# Patient Record
Sex: Female | Born: 1970 | Race: Black or African American | Hispanic: No | Marital: Married | Smoking: Never smoker
Health system: Southern US, Community
[De-identification: ages and names within clinical notes are randomized; demographics above are authoritative.]

## PROBLEM LIST (undated history)

## (undated) DIAGNOSIS — I82409 Acute embolism and thrombosis of unspecified deep veins of unspecified lower extremity: Secondary | ICD-10-CM

## (undated) DIAGNOSIS — E041 Nontoxic single thyroid nodule: Secondary | ICD-10-CM

## (undated) DIAGNOSIS — L509 Urticaria, unspecified: Secondary | ICD-10-CM

## (undated) HISTORY — DX: Nontoxic single thyroid nodule: E04.1

## (undated) HISTORY — PX: BUNIONECTOMY: SHX129

## (undated) HISTORY — PX: TONSILLECTOMY: SUR1361

## (undated) HISTORY — PX: CRYOABLATION: SHX1415

## (undated) HISTORY — DX: Acute embolism and thrombosis of unspecified deep veins of unspecified lower extremity: I82.409

---

## 2005-09-08 ENCOUNTER — Ambulatory Visit: Payer: Self-pay

## 2010-08-24 ENCOUNTER — Ambulatory Visit: Payer: Self-pay

## 2010-08-26 ENCOUNTER — Ambulatory Visit: Payer: Self-pay

## 2011-09-07 ENCOUNTER — Ambulatory Visit: Payer: Self-pay

## 2011-09-09 ENCOUNTER — Ambulatory Visit: Payer: Self-pay

## 2012-04-29 ENCOUNTER — Emergency Department: Payer: Self-pay | Admitting: Emergency Medicine

## 2012-07-31 ENCOUNTER — Ambulatory Visit: Payer: Self-pay | Admitting: Internal Medicine

## 2012-08-06 ENCOUNTER — Ambulatory Visit: Payer: Self-pay | Admitting: Emergency Medicine

## 2012-09-14 ENCOUNTER — Ambulatory Visit: Payer: Self-pay

## 2013-03-04 ENCOUNTER — Ambulatory Visit: Payer: Self-pay | Admitting: Physician Assistant

## 2013-04-17 ENCOUNTER — Ambulatory Visit: Payer: Self-pay | Admitting: Internal Medicine

## 2013-06-01 ENCOUNTER — Inpatient Hospital Stay: Payer: Self-pay | Admitting: Internal Medicine

## 2013-06-01 LAB — CBC WITH DIFFERENTIAL/PLATELET
Basophil #: 0.1 10*3/uL (ref 0.0–0.1)
Basophil %: 0.8 %
Eosinophil #: 0.1 10*3/uL (ref 0.0–0.7)
Eosinophil %: 1 %
HCT: 39.9 % (ref 35.0–47.0)
Lymphocyte %: 14.9 %
MCHC: 33.7 g/dL (ref 32.0–36.0)
Neutrophil #: 7.2 10*3/uL — ABNORMAL HIGH (ref 1.4–6.5)
Neutrophil %: 78.3 %
Platelet: 223 10*3/uL (ref 150–440)

## 2013-06-01 LAB — COMPREHENSIVE METABOLIC PANEL
Albumin: 3.1 g/dL — ABNORMAL LOW (ref 3.4–5.0)
Alkaline Phosphatase: 70 U/L (ref 50–136)
Anion Gap: 6 — ABNORMAL LOW (ref 7–16)
Bilirubin,Total: 0.3 mg/dL (ref 0.2–1.0)
Calcium, Total: 8.9 mg/dL (ref 8.5–10.1)
EGFR (African American): >60
EGFR (Non-African Amer.): 55 — ABNORMAL LOW
Glucose: 98 mg/dL (ref 65–99)
Osmolality: 281 (ref 275–301)
Sodium: 140 mmol/L (ref 136–145)
Total Protein: 7.4 g/dL (ref 6.4–8.2)

## 2013-06-01 LAB — PROTIME-INR: Prothrombin Time: 13.5 secs (ref 11.5–14.7)

## 2013-06-02 LAB — CBC WITH DIFFERENTIAL/PLATELET
Basophil #: 0.1 10*3/uL (ref 0.0–0.1)
Eosinophil #: 0.2 10*3/uL (ref 0.0–0.7)
HCT: 37.3 % (ref 35.0–47.0)
HGB: 12.5 g/dL (ref 12.0–16.0)
Lymphocyte %: 32.6 %
MCH: 29.2 pg (ref 26.0–34.0)
MCV: 87 fL (ref 80–100)
Monocyte #: 0.5 x10 3/mm (ref 0.2–0.9)
Platelet: 210 10*3/uL (ref 150–440)
RDW: 13.1 % (ref 11.5–14.5)
WBC: 7.3 10*3/uL (ref 3.6–11.0)

## 2013-06-02 LAB — TSH: Thyroid Stimulating Horm: 5.39 u[IU]/mL — ABNORMAL HIGH

## 2013-06-02 LAB — LIPID PANEL: HDL Cholesterol: 59 mg/dL (ref 40–60)

## 2013-06-18 ENCOUNTER — Ambulatory Visit: Payer: Self-pay | Admitting: Oncology

## 2013-06-24 ENCOUNTER — Ambulatory Visit: Payer: Self-pay | Admitting: Oncology

## 2013-09-12 ENCOUNTER — Ambulatory Visit: Payer: Self-pay | Admitting: Internal Medicine

## 2013-09-16 ENCOUNTER — Ambulatory Visit: Payer: Self-pay

## 2013-11-19 ENCOUNTER — Ambulatory Visit: Payer: Self-pay | Admitting: Internal Medicine

## 2013-12-10 ENCOUNTER — Ambulatory Visit: Payer: Self-pay | Admitting: Family Medicine

## 2013-12-19 ENCOUNTER — Ambulatory Visit: Payer: Self-pay

## 2013-12-30 DIAGNOSIS — J45909 Unspecified asthma, uncomplicated: Secondary | ICD-10-CM | POA: Insufficient documentation

## 2013-12-30 DIAGNOSIS — Z86718 Personal history of other venous thrombosis and embolism: Secondary | ICD-10-CM | POA: Insufficient documentation

## 2013-12-30 DIAGNOSIS — K589 Irritable bowel syndrome without diarrhea: Secondary | ICD-10-CM | POA: Insufficient documentation

## 2014-01-13 ENCOUNTER — Ambulatory Visit: Payer: Self-pay

## 2014-01-22 ENCOUNTER — Ambulatory Visit: Payer: Self-pay | Admitting: Obstetrics and Gynecology

## 2014-03-24 DIAGNOSIS — N920 Excessive and frequent menstruation with regular cycle: Secondary | ICD-10-CM | POA: Insufficient documentation

## 2014-10-01 ENCOUNTER — Ambulatory Visit: Payer: Self-pay | Admitting: Physician Assistant

## 2014-11-14 NOTE — Consult Note (Signed)
Physican discharged prior to evaluation.  Will f/u in in the cancer center in ~3 weeks to discuss results of hypercoag workup. consult.  Electronic Signatures: Gerarda FractionFinnegan, Arleigh Dicola (MD)  (Signed on 10-Nov-14 15:53)  Authored  Last Updated: 10-Nov-14 15:53 by Gerarda FractionFinnegan, Jaycey Gens (MD)

## 2014-11-14 NOTE — H&P (Signed)
PATIENT NAME:  Kelly Schmidt, YAZZIE MR#:  [id number] DATE OF BIRTH:  [birth year]  DATE OF ADMISSION:  06/01/2013  PRIMARY CARE PHYSICIAN:  Dr. Judithann Graves.  CHIEF COMPLAINT:   Left leg tenderness and swollen for 2 days.   HISTORY OF PRESENT ILLNESS:  The patient is 44 year old African American female with no past medical history, presented in the ED with left lower extremity tenderness and swelling since yesterday. The patient denies any fever or chills. No cough, sputum, or shortness of breath. No chest pain, palpitation, orthopnea or nocturnal dyspnea. 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 The patient also has been on NuvaRing for 13 years for contraception. The patient denies any family history of blood clot. The patient was noted to have a DVT.  PAST MEDICAL HISTORY:  Spine DJD.   PAST SURGICAL HISTORY:  Tonsillectomy, bunionectomy.  SOCIAL HISTORY:  No smoking or drinking or illicit drugs.   FAMILY HISTORY:  Hypertension in her father.   HOME MEDICATIONS: 1.  NuvaRing 0.12 mg/0.015 mg vaginal ring. 2.  Ibuprofen 800 mg p.o. t.i.d. p.r.n.   ALLERGIES:  None.   REVIEW OF SYSTEMS:  CONSTITUTIONAL:  Denies any fever or chills. No headache or dizziness. No weakness.  EYES:  No double vision or blurry vision,  EAR, NOSE, THROAT:  No postnasal drip, slurred speech or dysphagia.  CARDIOVASCULAR:  No chest pain, palpitation, orthopnea, or nocturnal dyspnea, but has left lower extremity swelling.  PULMONARY:  No cough, sputum, shortness of breath or hemoptysis.  GASTROINTESTINAL:  No abdominal pain, nausea, vomiting or diarrhea. No melena or bloody stool.  GENITOURINARY:  No dysuria, hematuria, or incontinence.  SKIN:  No rash or jaundice.  NEUROLOGY:  No syncope, loss of consciousness, or seizure.  HEMATOLOGIC:  No easy bruising or bleeding.  ENDOCRINE:  No polyuria or polydipsia, heat or cold intolerance.   SKIN:  No rash or jaundice.   PHYSICAL EXAMINATION:  VITAL SIGNS:  Temperature 98.2, blood pressure 94/49, pulse 86, respirations 18, O2 saturation 98% in room air.  GENERAL:  Alert, awake, oriented, in no acute distress.  HEENT:  Pupils round, equal and reactive to light and accommodation.  NECK:  Supple. No JVD or carotid bruit. No lymphadenopathy. No thyromegaly. Moist oral mucosa. Clear pharynx.  CARDIOVASCULAR:  S1, S2 regular rate and rhythm. No murmurs, gallops.  PULMONARY:  Bilateral air entry. No wheezing or rales. No use of accessory muscle to breathe.  ABDOMEN:  Soft. No distention or tenderness. No organomegaly. Bowel sounds present.  EXTREMITIES:  No edema, clubbing or cyanosis. No calf tenderness. Strong bilateral pedal pulses. Left lower extremity swelling and weakness, has calf tenderness and swelling. No erythema. NEUROLOGIC:  A and O x 3. No focal deficit. Power 5/5. Sensation intact.  SKIN:  No rash or jaundice.   LABORATORY DATA:  INR 1.0. CBC are in normal range. Glucose 98, BUN 17, creatinine 1.21. Electrolytes normal. Venous duplex show acute thrombosis within superficial femoral vein distal to include popliteal vein and a small vein below the knee.   IMPRESSION:  Left lower extremity deep vein thrombosis.   PLAN OF TREATMENT:  1.  The patient will be placed for observation. Stop nuvaring,  and start Lovenox, 1 mg/kg subcutaneous q.12 hours. Also discussed with the patient about anticoagulation treatment with either warfarin or Xarelto, Pradaxa or Eliquis. Discussed the benefits and side effect of each medication with the  patient. The patient cannot decide now. She will discuss with the family member and decide tomorrow. For now, we will start Lovenox. 2.  GI prophylaxis.  I discussed the patient's condition and plan of treatment with the patient.   TIME SPENT:  About 45 minutes.   ____________________________ Shaune PollackQing Mieczyslaw Stamas, MD qc:jm D: 06/01/2013 10:00:29  ET T: 06/01/2013 10:34:28 ET JOB#: [id number]  cc: Shaune PollackQing Elston Aldape, MD, <Dictator> Shaune PollackQING Jeanpaul Biehl MD ELECTRONICALLY SIGNED 06/01/2013 13:56

## 2014-11-14 NOTE — Consult Note (Signed)
Although patient's DVT is likely related to her Nova-ring and immobility from riding in her car for a reported ~200 miles per day, have initiated a full hypercoagulable workup for completeness.  Agree with Lovenox and Xarelto.  Full consult to follow.  Electronic Signatures: Gerarda FractionFinnegan, Timothy (MD)  (Signed on 09-Nov-14 10:21)  Authored  Last Updated: 09-Nov-14 10:21 by Gerarda FractionFinnegan, Timothy (MD)

## 2014-11-14 NOTE — Discharge Summary (Signed)
PATIENT NAME:  Kelly Schmidt, Kelly Schmidt MR#:  [id number] DATE OF BIRTH:  14-Aug-1970  DATE OF ADMISSION:  06/01/2013 DATE OF DISCHARGE:  06/03/2013  PRIMARY CARE PHYSICIAN:  Dr. [name].    DISCHARGE DIAGNOSES: PE and left leg deep vein thrombosis.   CONDITION: Stable.   CODE STATUS: FULL CODE.   HOME MEDICATIONS:  1.  Percocet 325 mg/5 mg p.o. q.6 hours p.r.n.  2.  Xarelto 15 mg p.o. b.i.d. for 20 days, and then change to 20 mg p.o. daily.   DIET: Regular diet.   ACTIVITY: As tolerated.   FOLLOWUP CARE: Follow up with PCP within 1 to 2 weeks. Follow up with Dr. Welton FlakesKhan, pulmonary specialist, within 1 to 2 weeks. Follow up with Dr. Orlie DakinFinnegan, oncologist, within 1 to 2 weeks. The patient also needs to watch for bleeding.   REASON FOR ADMISSION: Left leg tenderness and swollen for 2 days.   HOSPITAL COURSE: The patient is a 44 year old African American female with no past history, who presented to the ED with some left lower extremity tenderness and swelling for 2 days.  The patient had no fever or chills, chest pain. No palpitations. No edema. No orthopnea or nocturnal dyspnea.   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 The patient is using NuvaRing for 13 years for contraception. The patient'Schmidt venous duplex  showed left lower extremity DVT. For detailed history and physical examination, please refer to the admission note dictated by me. On admission date, the patient'Schmidt laboratory  data was in normal range.   1.  Deep vein thrombosis. After admission, the patient had been treated with Lovenox 1 mg subQ q.12 hours. The patient underwent a CT angio to rule out PE, which showed pulmonary embolism. I discussed the patient'Schmidt condition and treatment plan with Dr. Welton FlakesKhan, pulmonary specialist,  and the plan of treatment also discussed with the patient about anticoagulation treatment and the benefits and side effects of Coumadin, Xarelto, Pradaxa and Eliquis. The patient preferred Xarelto. I  also discussed it with Dr. Welton FlakesKhan, pulmonary specialist. He suggested to start Xarelto and follow up with him as an outpatient. Dr.  Orlie DakinFinnegan suggested coagulation workup. The patient has no complaints after admission. Her vital signs are stable. She only has left calf tenderness. She is clinically stable and will be discharged to home today. I discussed the patient'Schmidt discharge plan with the patient, the patient'Schmidt husband, nurse, case manager and Dr. Welton FlakesKhan.   TIME SPENT: About 35 minutes.   ____________________________ Shaune PollackQing Fidelis Loth, MD qc:dmm D: 06/03/2013 15:48:00 ET T: 06/03/2013 20:02:42 ET JOB#: [id number]  cc: Shaune PollackQing Jasmane Brockway, MD, <Dictator> Shaune PollackQING Justyce Yeater MD ELECTRONICALLY SIGNED 06/04/2013 13:35

## 2015-03-20 ENCOUNTER — Encounter: Payer: Self-pay | Admitting: Family Medicine

## 2015-03-20 ENCOUNTER — Ambulatory Visit (INDEPENDENT_AMBULATORY_CARE_PROVIDER_SITE_OTHER): Payer: BC Managed Care – PPO | Admitting: Family Medicine

## 2015-03-20 VITALS — BP 122/62 | HR 80 | Ht 65.0 in | Wt 179.0 lb

## 2015-03-20 DIAGNOSIS — M519 Unspecified thoracic, thoracolumbar and lumbosacral intervertebral disc disorder: Secondary | ICD-10-CM | POA: Diagnosis not present

## 2015-03-20 MED ORDER — PREDNISONE 10 MG PO TABS: 10.0000 mg | ORAL_TABLET | Freq: Every day | ORAL | Status: DC

## 2015-03-20 MED ORDER — CYCLOBENZAPRINE HCL 10 MG PO TABS: 10.0000 mg | ORAL_TABLET | Freq: Three times a day (TID) | ORAL | Status: DC | PRN

## 2015-03-20 NOTE — Progress Notes (Signed)
Name: Kelly Schmidt   MRN: [id number]    DOB: March 31, 1971   Date:03/20/2015       Progress Note  Subjective  Chief Complaint  Chief Complaint  Patient presents with  . Follow-up    last Wed- back started having spasms- took valium at night for sleep    Back Pain This is a new (previous back discomfort) problem. The current episode started in the past 7 days. The problem occurs 2 to 4 times per day. The problem has been gradually improving since onset. The pain is present in the lumbar spine (thoracolumbar). The quality of the pain is described as aching. The pain does not radiate. The pain is moderate. The pain is worse during the night. The symptoms are aggravated by lying down and sitting (sit up in am). Stiffness is present in the morning. Pertinent negatives include no abdominal pain, bladder incontinence, bowel incontinence, chest pain, dysuria, fever, headaches, leg pain, numbness, paresis, paresthesias, perianal numbness, tingling, weakness or weight loss. She has tried analgesics for the symptoms. The treatment provided mild relief.    No problem-specific assessment & plan notes found for this encounter.   History reviewed. No pertinent past medical history.  Past Surgical History  Procedure Laterality Date  . Tonsillectomy    . Bunionectomy    . Cryoablation      heavy periods    History reviewed. No pertinent family history.  Social History   Social History  . Marital Status: Married    Spouse Name: N/A  . Number of Children: N/A  . Years of Education: N/A   Occupational History  . Not on file.   Social History Main Topics  . Smoking status: Never Smoker   . Smokeless tobacco: Not on file  . Alcohol Use: 0.0 oz/week    0 Standard drinks or equivalent per week  . Drug Use: No  . Sexual Activity: Yes   Other Topics Concern  . Not on file   Social History Narrative  . No narrative on file    Allergies  Allergen Reactions  . Aspirin Hives  .  Lidocaine      Review of Systems  Constitutional: Negative for fever, chills, weight loss and malaise/fatigue.  HENT: Negative for ear discharge, ear pain and sore throat.   Eyes: Negative for blurred vision.  Respiratory: Negative for cough, sputum production, shortness of breath and wheezing.   Cardiovascular: Negative for chest pain, palpitations and leg swelling.  Gastrointestinal: Negative for heartburn, nausea, abdominal pain, diarrhea, constipation, blood in stool, melena and bowel incontinence.  Genitourinary: Negative for bladder incontinence, dysuria, urgency, frequency and hematuria.  Musculoskeletal: Positive for back pain. Negative for myalgias, joint pain and neck pain.  Skin: Negative for rash.  Neurological: Negative for dizziness, tingling, sensory change, focal weakness, weakness, numbness, headaches and paresthesias.  Endo/Heme/Allergies: Negative for environmental allergies and polydipsia. Does not bruise/bleed easily.  Psychiatric/Behavioral: Negative for depression and suicidal ideas. The patient is not nervous/anxious and does not have insomnia.      Objective  Filed Vitals:   03/20/15 1425  BP: 122/62  Pulse: 80  Height: 5\' 5"  (1.651 m)  Weight: 179 lb (81.194 kg)    Physical Exam  Constitutional: She is well-developed, well-nourished, and in no distress. No distress.  HENT:  Head: Normocephalic and atraumatic.  Right Ear: External ear normal.  Left Ear: External ear normal.  Nose: Nose normal.  Mouth/Throat: Oropharynx is clear and moist.  Eyes: Conjunctivae and EOM are  normal. Pupils are equal, round, and reactive to light. Right eye exhibits no discharge. Left eye exhibits no discharge.  Neck: Normal range of motion. Neck supple. No JVD present. No thyromegaly present.  Cardiovascular: Normal rate, regular rhythm, normal heart sounds and intact distal pulses.  Exam reveals no gallop and no friction rub.   No murmur heard. Pulmonary/Chest: Effort  normal and breath sounds normal.  Abdominal: Soft. Bowel sounds are normal. She exhibits no mass. There is no tenderness. There is no guarding.  Musculoskeletal: Normal range of motion. She exhibits no edema.       Lumbar back: She exhibits spasm.  Lymphadenopathy:    She has no cervical adenopathy.  Neurological: She is alert. She has normal reflexes.  Skin: Skin is warm and dry. She is not diaphoretic.  Psychiatric: Mood and affect normal.      Assessment & Plan  Problem List Items Addressed This Visit      Musculoskeletal and Integument   Lumbar disc disease - Primary   Relevant Medications   cyclobenzaprine (FLEXERIL) 10 MG tablet   predniSONE (DELTASONE) 10 MG tablet        Dr. Hayden [facility]  03/20/2015

## 2015-05-13 ENCOUNTER — Encounter: Payer: Self-pay | Admitting: Internal Medicine

## 2015-05-20 ENCOUNTER — Encounter: Payer: Self-pay | Admitting: Internal Medicine

## 2015-05-20 ENCOUNTER — Ambulatory Visit (INDEPENDENT_AMBULATORY_CARE_PROVIDER_SITE_OTHER): Payer: BC Managed Care – PPO | Admitting: Internal Medicine

## 2015-05-20 VITALS — BP 120/70 | HR 68 | Ht 65.0 in | Wt 181.0 lb

## 2015-05-20 DIAGNOSIS — B9689 Other specified bacterial agents as the cause of diseases classified elsewhere: Secondary | ICD-10-CM

## 2015-05-20 DIAGNOSIS — N760 Acute vaginitis: Secondary | ICD-10-CM

## 2015-05-20 DIAGNOSIS — A499 Bacterial infection, unspecified: Secondary | ICD-10-CM

## 2015-05-20 LAB — POCT WET PREP WITH KOH
Clue Cells Wet Prep HPF POC: 5
KOH PREP POC: NEGATIVE
RBC Wet Prep HPF POC: 2
TRICHOMONAS UA: NEGATIVE
WBC WET PREP PER HPF POC: 10
YEAST WET PREP PER HPF POC: 0

## 2015-05-20 MED ORDER — METRONIDAZOLE 500 MG PO TABS: 500.0000 mg | ORAL_TABLET | Freq: Two times a day (BID) | ORAL | Status: DC

## 2015-05-20 NOTE — Progress Notes (Signed)
Date:  05/20/2015   Name:  Kelly Schmidt   DOB:  07/21/1971   MRN:  [id number]   Chief Complaint: Vaginitis Vaginal Discharge The patient's primary symptoms include a genital odor and vaginal discharge (scant clear mucus). The patient's pertinent negatives include no genital itching, genital lesions, genital rash or pelvic pain. The current episode started in the past 7 days. The problem has been unchanged. Pertinent negatives include no back pain, chills, fever, rash or urgency.   She denies any new exposures. No new personal care products. Symptoms seemed to worsen after her last menses. She is very bothered by the odor and her husband has noticed it as well. She has not tried any over-the-counter products other than a single [employer] feminine wash.   Review of Systems  Constitutional: Negative for fever and chills.  Respiratory: Negative for shortness of breath.   Cardiovascular: Negative for chest pain.  Genitourinary: Positive for vaginal discharge (scant clear mucus). Negative for urgency, vaginal bleeding and pelvic pain.  Musculoskeletal: Negative for back pain.  Skin: Negative for rash.    Patient Active Problem List   Diagnosis Date Noted  . Lumbar disc disease 03/20/2015  . Excess, menstruation 03/24/2014  . 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 H/O deep venous thrombosis 12/30/2013  . 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 Authorizing Provider  cyclobenzaprine (FLEXERIL) 10 MG tablet Take 1 tablet (10 mg total) by mouth 3 (three) times daily as needed for muscle spasms. Patient not taking: Reported on 05/20/2015 03/20/15   Duanne Limerickeanna C Jones, MD    Allergies  Allergen Reactions  . Aspirin Hives  . Lidocaine     Past Surgical History  Procedure Laterality Date  . Tonsillectomy    . Bunionectomy    . Cryoablation      heavy periods    Social History  Substance Use Topics  . Smoking  status: Never Smoker   . Smokeless tobacco: None  . Alcohol Use: 0.0 oz/week    0 Standard drinks or equivalent per week     Medication list has been reviewed and updated.   Physical Exam  Constitutional: She is oriented to person, place, and time. She appears well-developed. No distress.  HENT:  Head: Normocephalic and atraumatic.  Eyes: Conjunctivae are normal. Right eye exhibits no discharge. Left eye exhibits no discharge. No scleral icterus.  Cardiovascular: Normal rate and regular rhythm.   Pulmonary/Chest: Effort normal and breath sounds normal. No respiratory distress.  Abdominal: Soft. There is no tenderness.  Genitourinary: There is no rash or tenderness on the right labia. There is no rash or tenderness on the left labia. No erythema or bleeding in the vagina. Vaginal discharge (scant) found.  Musculoskeletal: Normal range of motion.  Neurological: She is alert and oriented to person, place, and time.  Skin: Skin is warm and dry. No rash noted.  Psychiatric: She has a normal mood and affect. Her behavior is normal. Thought content normal.  Nursing note and vitals reviewed.   BP 120/70 mmHg  Pulse 68  Ht 5\' 5"  (1.651 m)  Wt 181 lb (82.101 kg)  BMI 30.12 kg/m2  Assessment and Plan: 1. Bacterial vaginitis Wet prep shows clue cells and bacteria but no yeast or Trichomonas - metroNIDAZOLE (FLAGYL) 500 MG tablet; Take 1 tablet (500 mg total) by mouth 2 (two) times daily.  Dispense: 14 tablet; Refill: 0 - POCT Wet Prep with KOH  Bari Edward, MD [facility]  05/20/2015

## 2015-10-23 ENCOUNTER — Encounter: Payer: Self-pay | Admitting: Internal Medicine

## 2015-10-23 ENCOUNTER — Ambulatory Visit (INDEPENDENT_AMBULATORY_CARE_PROVIDER_SITE_OTHER): Payer: BC Managed Care – PPO | Admitting: Internal Medicine

## 2015-10-23 VITALS — BP 98/62 | HR 72 | Temp 98.2°F | Ht 65.0 in | Wt 179.0 lb

## 2015-10-23 DIAGNOSIS — Z20828 Contact with and (suspected) exposure to other viral communicable diseases: Secondary | ICD-10-CM | POA: Diagnosis not present

## 2015-10-23 MED ORDER — OSELTAMIVIR PHOSPHATE 75 MG PO CAPS: 75.0000 mg | ORAL_CAPSULE | Freq: Two times a day (BID) | ORAL | Status: DC

## 2015-10-23 NOTE — Patient Instructions (Signed)

## 2015-10-23 NOTE — Progress Notes (Signed)
    Date:  10/23/2015   Name:  Kelly Schmidt   DOB:  [birth year]   MRN:  [id number]   Chief Complaint: Fever Fever  This is a new problem. Associated symptoms include a sore throat. Pertinent negatives include no chest pain, coughing, ear pain, headaches or wheezing.  She had a low grade temperature last evening 99.0 and sore throat.  No headache or cough.  Mild myalgias but may be from exercise.  This AM she took tylenol for temp 99.1.  Her step son was diagnosed with influenza and PNA last weekend.  She did not get an influenza vaccine.    Review of Systems  Constitutional: Positive for fever. Negative for chills and diaphoresis.  HENT: Positive for sore throat. Negative for ear pain, sinus pressure and trouble swallowing.   Respiratory: Negative for cough, shortness of breath and wheezing.   Cardiovascular: Negative for chest pain.  Musculoskeletal: Positive for myalgias and arthralgias.  Neurological: Negative for weakness and headaches.    Patient Active Problem List   Diagnosis Date Noted  . Lumbar disc disease 03/20/2015  . Excess, menstruation 03/24/2014  . 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 H/O deep venous thrombosis 12/30/2013  . 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 Aspirin Hives  . Lidocaine     Past Surgical History  Procedure Laterality Date  . Tonsillectomy    . Bunionectomy    . Cryoablation      heavy periods    Social History  Substance Use Topics  . Smoking status: Never Smoker   . Smokeless tobacco: None  . Alcohol Use: 0.0 oz/week    0 Standard drinks or equivalent per week     Comment: occasional     Medication list has been reviewed and updated.   Physical Exam  Constitutional: She appears well-developed and well-nourished.  HENT:  Right Ear: Tympanic membrane and ear canal normal.  Left Ear: Tympanic membrane and ear canal normal.  Nose: Right  sinus exhibits no maxillary sinus tenderness. Left sinus exhibits no maxillary sinus tenderness.  Mouth/Throat: Posterior oropharyngeal erythema present. No posterior oropharyngeal edema.  Cardiovascular: Normal rate, regular rhythm and normal heart sounds.   Pulmonary/Chest: Effort normal.  Lymphadenopathy:    She has no cervical adenopathy.  Psychiatric: She has a normal mood and affect.  Nursing note and vitals reviewed.   BP 98/62 mmHg  Pulse 72  Temp(Src) 98.2 F (36.8 C) (Oral)  Ht 5\' 5"  (1.651 m)  Wt 179 lb (81.194 kg)  BMI 29.79 kg/m2  SpO2 99%  LMP 10/05/2015  Assessment and Plan: 1. Exposure to influenza Continue tylenol and rest for possible early influenza - oseltamivir (TAMIFLU) 75 MG capsule; Take 1 capsule (75 mg total) by mouth 2 (two) times daily.  Dispense: 10 capsule; Refill: 0   Bari EdwardLaura Andera Cranmer, MD [facility]  10/23/2015

## 2015-12-11 ENCOUNTER — Ambulatory Visit
Admission: RE | Admit: 2015-12-11 | Discharge: 2015-12-11 | Disposition: A | Discharge status: Home / Self Care | Payer: BC Managed Care – PPO | Source: Ambulatory Visit | Attending: Internal Medicine | Admitting: Internal Medicine

## 2015-12-11 ENCOUNTER — Encounter: Payer: Self-pay | Admitting: Internal Medicine

## 2015-12-11 ENCOUNTER — Ambulatory Visit (INDEPENDENT_AMBULATORY_CARE_PROVIDER_SITE_OTHER): Payer: BC Managed Care – PPO | Admitting: Internal Medicine

## 2015-12-11 VITALS — BP 98/76 | HR 74 | Resp 16 | Ht 65.0 in | Wt 179.0 lb

## 2015-12-11 DIAGNOSIS — M25561 Pain in right knee: Secondary | ICD-10-CM | POA: Diagnosis not present

## 2015-12-11 DIAGNOSIS — Z77123 Contact with and (suspected) exposure to radon and other naturally occuring radiation: Secondary | ICD-10-CM | POA: Diagnosis not present

## 2015-12-11 DIAGNOSIS — IMO0002 Reserved for concepts with insufficient information to code with codable children: Secondary | ICD-10-CM

## 2015-12-11 LAB — POCT URINE PREGNANCY: PREG TEST UR: NEGATIVE

## 2015-12-11 MED ORDER — METHYLPREDNISOLONE 4 MG PO TBPK: ORAL_TABLET | ORAL | Status: DC

## 2015-12-11 NOTE — Progress Notes (Signed)
    Date:  12/11/2015   Name:  Kelly Schmidt   DOB:  [birth year]   MRN:  [id number]   Chief Complaint: Knee Pain Right knee- HX of blood clots-Has added some activity [facility] and now having swelling and pain.  Discomfort is in the right anterior knee behind the kneecap. It is slightly swollen but not red or hot. She denies any calf pain or lower leg swelling. She's been taking Tylenol and ibuprofen off and on but not consistently. She has some mild discomfort and catching at times no weakness.  Review of Systems  Respiratory: Negative for shortness of breath.   Cardiovascular: Negative for chest pain, palpitations and leg swelling.  Musculoskeletal: Positive for joint swelling and arthralgias.  Neurological: Negative for weakness and numbness.  Hematological: Negative for adenopathy.    Patient Active Problem List   Diagnosis Date Noted  . Lumbar disc disease 03/20/2015  . Excess, menstruation 03/24/2014  . 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 H/O deep venous thrombosis 12/30/2013  . 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 Authorizing Provider  ibuprofen (ADVIL,MOTRIN) 200 MG tablet Take 200 mg by mouth every 6 (six) hours as needed.   Yes Historical Provider, MD    Allergies  Allergen Reactions  . Aspirin Hives  . Lidocaine     Past Surgical History  Procedure Laterality Date  . Tonsillectomy    . Bunionectomy    . Cryoablation      heavy periods    Social History  Substance Use Topics  . Smoking status: Never Smoker   . Smokeless tobacco: None  . Alcohol Use: 0.0 oz/week    0 Standard drinks or equivalent per week     Comment: occasional     Medication list has been reviewed and updated.   Physical Exam  Cardiovascular: Normal rate, regular rhythm and normal heart sounds.   Pulmonary/Chest: Effort normal and breath sounds normal.  Musculoskeletal:   Right knee: She exhibits decreased range of motion and effusion (small). She exhibits no ecchymosis and no deformity. Tenderness found. Patellar tendon tenderness noted.  Nursing note and vitals reviewed.   BP 98/76 mmHg  Pulse 74  Resp 16  Ht 5\' 5"  (1.651 m)  Wt 179 lb (81.194 kg)  BMI 29.79 kg/m2  SpO2 98%  Assessment and Plan: 1. Right knee pain Begin Ice 20 minutes twice a day Activity limitations discussed - methylPREDNISolone (MEDROL DOSEPAK) 4 MG TBPK tablet; Take 6 pills on day 1 the 5 pills day 2 then 4 pills day 3 then 3 pills day 4 then 2 pills day 5 then one pills day 6 then stop  Dispense: 21 tablet; Refill: 0 - DG Knee Complete 4 Views Right; Future  2. Radiation exposure Test done for anticipated Xray and is negative - POCT urine pregnancy   Kelly EdwardLaura Teneisha Gignac, MD [facility]  12/11/2015

## 2016-04-19 ENCOUNTER — Ambulatory Visit
Admission: EM | Admit: 2016-04-19 | Discharge: 2016-04-19 | Disposition: A | Discharge status: Home / Self Care | Payer: BC Managed Care – PPO | Attending: Emergency Medicine | Admitting: Emergency Medicine

## 2016-04-19 DIAGNOSIS — H65192 Other acute nonsuppurative otitis media, left ear: Secondary | ICD-10-CM

## 2016-04-19 DIAGNOSIS — J0110 Acute frontal sinusitis, unspecified: Secondary | ICD-10-CM | POA: Diagnosis not present

## 2016-04-19 DIAGNOSIS — R05 Cough: Secondary | ICD-10-CM

## 2016-04-19 DIAGNOSIS — R059 Cough, unspecified: Secondary | ICD-10-CM

## 2016-04-19 MED ORDER — AMOXICILLIN-POT CLAVULANATE 875-125 MG PO TABS: 1.0000 | ORAL_TABLET | Freq: Two times a day (BID) | ORAL | 0 refills | Status: DC

## 2016-04-19 MED ORDER — BENZONATATE 100 MG PO CAPS: 100.0000 mg | ORAL_CAPSULE | Freq: Three times a day (TID) | ORAL | 0 refills | Status: DC | PRN

## 2016-04-19 NOTE — ED Provider Notes (Signed)
[facility] ____________________________________________  Time seen: Approximately 8:26 AM  I have reviewed the triage vital signs and the nursing notes.   HISTORY  Chief Complaint Cough and Otalgia   HPI Kelly Schmidt is a 45 y.o. female presents with a complaint of just over a week of runny nose, nasal congestion and left ear discomfort that has worsened over the last 2-3 days. Patient reports having a lot of congestion with some postnasal drainage. Patient reports cough is worse at night with associated postnasal drainage. Patient reports that cough is primarily a dry cough, occasional production. Patient reports recently blowing nose with thick drainage. Reports sinus feel clogged. Denies hearing changes, tinnitus, dizziness or vision changes. Reports others in household recently with similar.  Denies chest pain, chest pain with deep breath, shortness of breath, abdominal pain, dysuria, extremity pain, extremity swelling, weakness or fevers. Reports continues to eat and drink well. Reports normal activity levels.  [name], MD PCP Patient's last menstrual period was 03/25/2016. Denies chance of pregnancy.   Past Medical History:  Diagnosis Date  . 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 Reports has been clear without any complications since. Denies other risk factors other than the birth control.  Patient Active Problem List   Diagnosis Date Noted  . Lumbar disc disease 03/20/2015  . Excess, menstruation 03/24/2014  . 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 H/O deep venous thrombosis 12/30/2013  . 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  Past Surgical History:  Procedure Laterality Date  . BUNIONECTOMY    . CRYOABLATION     heavy periods  . TONSILLECTOMY      Current Outpatient Rx  . Order #: [id number] Class: Historical Med  . Order #: [id number] Class: Normal  . Order #:  [id number] Class: Normal  . Order #: [id number] Class: Normal    No current facility-administered medications for this encounter.   Current Outpatient Prescriptions:  .  ibuprofen (ADVIL,MOTRIN) 200 MG tablet, Take 200 mg by mouth every 6 (six) hours as needed., Disp: , Rfl:  .  amoxicillin-clavulanate (AUGMENTIN) 875-125 MG tablet, Take 1 tablet by mouth every 12 (twelve) hours., Disp: 20 tablet, Rfl: 0 .  benzonatate (TESSALON PERLES) 100 MG capsule, Take 1 capsule (100 mg total) by mouth 3 (three) times daily as needed for cough., Disp: 15 capsule, Rfl: 0   Allergies Aspirin and Lidocaine  Family History  Problem Relation Age of Onset  . Hypertension Mother     Social History Social History  Substance Use Topics  . Smoking status: Never Smoker  . Smokeless tobacco: Never Used  . Alcohol use 0.0 oz/week     Comment: occasional    Review of Systems Constitutional: No fever/chills Eyes: No visual changes. ENT: No sore throat. Cardiovascular: Denies chest pain. Respiratory: Denies shortness of breath. Gastrointestinal: No abdominal pain.  No nausea, no vomiting.  No diarrhea.  No constipation. Genitourinary: Negative for dysuria. Musculoskeletal: Negative for back pain. Skin: Negative for rash. Neurological: Negative for headaches, focal weakness or numbness.  10-point ROS otherwise negative.  ____________________________________________   PHYSICAL EXAM:  VITAL SIGNS: ED Triage Vitals  Enc Vitals Group     BP 04/19/16 0818 106/67     Pulse Rate 04/19/16 0818 71     Resp 04/19/16 0818 18     Temp 04/19/16 0818 98.1 F (36.7 C)     Temp Source 04/19/16 0818 Oral     SpO2 04/19/16 0818 98 %  Weight 04/19/16 0815 175 lb (79.4 kg)     Height 04/19/16 0815 5\' 5"  (1.651 m)     Head Circumference --      Peak Flow --      Pain Score 04/19/16 0818 2     Pain Loc --      Pain Edu? --      Excl. in GC? --    Constitutional: Alert and oriented. Well appearing and  in no acute distress. Eyes: Conjunctivae are normal. PERRL. EOMI. Head: Atraumatic. Mild  tenderness to palpation bilateral frontal and nontender maxillary sinuses. No swelling. No erythema.   Ears: Left: mild erythema, middle ear effusion present, otherwise normal TM. Right: no erythema, normal TM. No erythema, swelling or tenderness surrounding bilaterally.   Nose: nasal congestion with bilateral nasal turbinate erythema and edema.   Mouth/Throat: Mucous membranes are moist.  Oropharynx non-erythematous.No tonsillar swelling or exudate.  Neck: No stridor.  No cervical spine tenderness to palpation. Hematological/Lymphatic/Immunilogical: No cervical lymphadenopathy. Cardiovascular: Normal rate, regular rhythm. Grossly normal heart sounds.  Good peripheral circulation. Respiratory: Normal respiratory effort.  No retractions. Lungs CTAB. No wheezes, rales or rhonchi. Good air movement.  Gastrointestinal: Soft and nontender. No distention. Normal Bowel sounds. No CVA tenderness. Musculoskeletal: No lower or upper extremity tenderness nor edema.  Bilateral pedal pulses equal and easily palpated. No cervical, thoracic or lumbar tenderness to palpation.  Neurologic:  Normal speech and language. No gross focal neurologic deficits are appreciated. No gait instability. Skin:  Skin is warm, dry and intact. No rash noted. Psychiatric: Mood and affect are normal. Speech and behavior are normal.  ___________________________________________   LABS (all labs ordered are listed, but only abnormal results are displayed)  Labs Reviewed - No data to display ____________________________________________  RADIOLOGY  No results found. ____________________________________________   PROCEDURES Procedures    INITIAL IMPRESSION / ASSESSMENT AND PLAN / ED COURSE  Pertinent labs & imaging results that were available during my care of the patient were reviewed by me and considered in my medical decision  making (see chart for details).  Very well-appearing patient. No acute distress. Presents for the complaints of just over 1 week of nasal congestion, nasal drainage and ear discomfort with associated cough. Suspect sinusitis. Will treat patient with oral Augmentin and prn Tessalon Perles. Encouraged rest, fluids and PCP follow-up as needed.Discussed indication, risks and benefits of medications with patient.  Discussed follow up with Primary care physician this week. Discussed follow up and return parameters including no resolution or any worsening concerns. Patient verbalized understanding and agreed to plan.   ____________________________________________   FINAL CLINICAL IMPRESSION(S) / ED DIAGNOSES  Final diagnoses:  Acute frontal sinusitis, recurrence not specified  Cough  Acute effusion of left ear     Discharge Medication List as of 04/19/2016  8:35 AM    START taking these medications   Details  amoxicillin-clavulanate (AUGMENTIN) 875-125 MG tablet Take 1 tablet by mouth every 12 (twelve) hours., Starting Tue 04/19/2016, Normal    benzonatate (TESSALON PERLES) 100 MG capsule Take 1 capsule (100 mg total) by mouth 3 (three) times daily as needed for cough., Starting Tue 04/19/2016, Normal        Note: This dictation was prepared with Dragon dictation along with smaller phrase technology. Any transcriptional errors that result from this process are unintentional.    Clinical Course      Renford Dills, NP 04/19/16 6045    Renford Dills, NP 04/19/16 [phone]

## 2016-04-19 NOTE — ED Triage Notes (Signed)
Patient c/o coughing and earaches. For about 2-3 days.

## 2016-04-19 NOTE — Discharge Instructions (Signed)
Take medication as prescribed. Rest. Drink plenty of fluids.  ° °Follow up with your primary care physician this week as needed. Return to Urgent care for new or worsening concerns.  ° °

## 2016-04-23 ENCOUNTER — Telehealth: Payer: Self-pay

## 2016-04-23 NOTE — Telephone Encounter (Signed)
Courtesy call back completed today for patient's recent visit at [facility]. Patient did not answer, left message on machine to call back with any questions or concerns.   

## 2016-04-29 ENCOUNTER — Ambulatory Visit (INDEPENDENT_AMBULATORY_CARE_PROVIDER_SITE_OTHER): Payer: BC Managed Care – PPO | Admitting: Internal Medicine

## 2016-04-29 ENCOUNTER — Encounter: Payer: Self-pay | Admitting: Internal Medicine

## 2016-04-29 VITALS — BP 120/72 | HR 84 | Ht 65.0 in | Wt 176.0 lb

## 2016-04-29 DIAGNOSIS — H6992 Unspecified Eustachian tube disorder, left ear: Secondary | ICD-10-CM | POA: Diagnosis not present

## 2016-04-29 NOTE — Patient Instructions (Addendum)
Resume fexofenadine 180 mg daily and Fluticasone nasal spray  Barotitis Media Barotitis media is inflammation of your middle ear. This occurs when the auditory tube (eustachian tube) leading from the back of your nose (nasopharynx) to your eardrum is blocked. This blockage may result from a cold, environmental allergies, or an upper respiratory infection. Unresolved barotitis media may lead to damage or hearing loss (barotrauma), which may become permanent. HOME CARE INSTRUCTIONS   Use medicines as recommended by your health care provider. Over-the-counter medicines will help unblock the canal and can help during times of air travel.  Do not put anything into your ears to clean or unplug them. Eardrops will not be helpful.  Do not swim, dive, or fly until your health care provider says it is all right to do so. If these activities are necessary, chewing gum with frequent, forceful swallowing may help. It is also helpful to hold your nose and gently blow to pop your ears for equalizing pressure changes. This forces air into the eustachian tube.  Only take over-the-counter or prescription medicines for pain, discomfort, or fever as directed by your health care provider.  A decongestant may be helpful in decongesting the middle ear and make pressure equalization easier. SEEK MEDICAL CARE IF:  You experience a serious form of dizziness in which you feel as if the room is spinning and you feel nauseated (vertigo).  Your symptoms only involve one ear. SEEK IMMEDIATE MEDICAL CARE IF:   You develop a severe headache, dizziness, or severe ear pain.  You have bloody or pus-like drainage from your ears.  You develop a fever.  Your problems do not improve or become worse. MAKE SURE YOU:   Understand these instructions.  Will watch your condition.  Will get help right away if you are not doing well or get worse.   This information is not intended to replace advice given to you by your health  care provider. Make sure you discuss any questions you have with your health care provider.   Document Released: 07/08/2000 Document Revised: 05/01/2013 Document Reviewed: 02/05/2013 Elsevier Interactive Patient Education [employer]2016 Elsevier Inc.

## 2016-04-29 NOTE — Progress Notes (Signed)
    Date:  04/29/2016   Name:  Kelly Schmidt   DOB:  1970-12-24   MRN:  [id number]   Chief Complaint: Follow-up (urgent care ear pain- finished Aug, Tessalon Perles and Medrol dose pack- L) ear is worse that R)/ feel "like they are closing up")  Otalgia   There is pain in the left ear. This is a new problem. The problem occurs constantly. The problem has been unchanged. There has been no fever. Associated symptoms include headaches. Pertinent negatives include no coughing, diarrhea or sore throat. She has tried antibiotics for the symptoms. The treatment provided no relief.  Treated with Augmentin and Tessalon.  Not taking any antihistamines or nasal spray.   Review of Systems  HENT: Positive for ear pain. Negative for sore throat.   Respiratory: Negative for cough.   Gastrointestinal: Negative for diarrhea.  Neurological: Positive for headaches.    Patient Active Problem List   Diagnosis Date Noted  . Lumbar disc disease 03/20/2015  . Excess, menstruation 03/24/2014  . 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 H/O deep venous thrombosis 12/30/2013  . 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 Authorizing Provider  ibuprofen (ADVIL,MOTRIN) 200 MG tablet Take 200 mg by mouth every 6 (six) hours as needed.   Yes Historical Provider, MD    Allergies  Allergen Reactions  . Aspirin Hives  . Lidocaine     Past Surgical History:  Procedure Laterality Date  . BUNIONECTOMY    . CRYOABLATION     heavy periods  . TONSILLECTOMY      Social History  Substance Use Topics  . Smoking status: Never Smoker  . Smokeless tobacco: Never Used  . Alcohol use 0.0 oz/week     Comment: occasional     Medication list has been reviewed and updated.   Physical Exam  Constitutional: She is oriented to person, place, and time. She appears well-developed. No distress.  HENT:  Head: Normocephalic and atraumatic.  Right  Ear: Tympanic membrane and ear canal normal.  Left Ear: Tympanic membrane and ear canal normal. Tympanic membrane is not erythematous.  No middle ear effusion.  Nose: Right sinus exhibits no maxillary sinus tenderness and no frontal sinus tenderness. Left sinus exhibits no maxillary sinus tenderness and no frontal sinus tenderness.  Mouth/Throat: Oropharynx is clear and moist.  Cardiovascular: Normal rate, regular rhythm and normal heart sounds.   Pulmonary/Chest: Effort normal and breath sounds normal. No respiratory distress.  Musculoskeletal: Normal range of motion.  Neurological: She is alert and oriented to person, place, and time.  Skin: Skin is warm and dry. No rash noted.  Psychiatric: She has a normal mood and affect. Her behavior is normal. Thought content normal.  Nursing note and vitals reviewed.   BP 120/72   Pulse 84   Ht 5\' 5"  (1.651 m)   Wt 176 lb (79.8 kg)   LMP 04/29/2016 (Exact Date)   BMI 29.29 kg/m   Assessment and Plan: 1. Eustachian tube disorder, left OM resolved - finish antibiotics Begin Allegra and Flonase NS   Bari EdwardLaura Casady Voshell, MD [facility]  04/29/2016

## 2016-06-26 ENCOUNTER — Ambulatory Visit
Admission: EM | Admit: 2016-06-26 | Discharge: 2016-06-26 | Disposition: A | Discharge status: Home / Self Care | Payer: BC Managed Care – PPO | Attending: Family Medicine | Admitting: Family Medicine

## 2016-06-26 ENCOUNTER — Encounter: Payer: Self-pay | Admitting: Emergency Medicine

## 2016-06-26 DIAGNOSIS — H6992 Unspecified Eustachian tube disorder, left ear: Secondary | ICD-10-CM | POA: Diagnosis not present

## 2016-06-26 NOTE — ED Provider Notes (Signed)
[facility]    CSN: [id number] Arrival date & time: 06/26/16  [id number]     History   Chief Complaint Chief Complaint  Patient presents with  . Otalgia    HPI Kelly Medinaamela Snipes Schmidt is a 45 y.o. female.   The history is provided by the patient.  Otalgia  Location:  Left Quality:  Aching and pressure Severity:  Moderate Onset quality:  Gradual Duration:  2 weeks Timing:  Constant Progression:  Worsening Chronicity:  Recurrent Context: not direct blow, not elevation change, not foreign body in ear, not loud noise, not recent URI and not water in ear   Relieved by:  Nothing Ineffective treatments:  OTC medications Associated symptoms: no abdominal pain, no congestion, no cough, no diarrhea, no ear discharge, no fever, no headaches, no hearing loss, no neck pain, no rash, no rhinorrhea, no sore throat, no tinnitus and no vomiting   Risk factors: no recent travel, no chronic ear infection and no prior ear surgery     Past Medical History:  Diagnosis Date  . DVT (deep venous thrombosis) [facility](HCC)     Patient Active Problem List   Diagnosis Date Noted  . Lumbar disc disease 03/20/2015  . Excess, menstruation 03/24/2014  . 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 H/O deep venous thrombosis 12/30/2013  . 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 BUNIONECTOMY    . CRYOABLATION     heavy periods  . TONSILLECTOMY      OB History    No data available       Home Medications    Prior to Admission medications   Medication Sig Start Date End Date Taking? Authorizing Provider  naproxen (NAPROSYN) 500 MG tablet Take 500 mg by mouth as needed.   Yes Historical Provider, MD    Family History Family History  Problem Relation Age of Onset  . Hypertension Mother     Social History Social History  Substance Use Topics  . Smoking status: Never Smoker  . Smokeless tobacco: Never Used  . Alcohol use 0.0 oz/week   Comment: occasional     Allergies   Aspirin and Lidocaine   Review of Systems Review of Systems  Constitutional: Negative for fever.  HENT: Positive for ear pain. Negative for congestion, ear discharge, hearing loss, rhinorrhea, sore throat and tinnitus.   Respiratory: Negative for cough.   Gastrointestinal: Negative for abdominal pain, diarrhea and vomiting.  Musculoskeletal: Negative for neck pain.  Skin: Negative for rash.  Neurological: Negative for headaches.     Physical Exam Triage Vital Signs ED Triage Vitals  Enc Vitals Group     BP 06/26/16 0841 109/65     Pulse Rate 06/26/16 0841 71     Resp 06/26/16 0841 16     Temp 06/26/16 0841 98.5 F (36.9 C)     Temp Source 06/26/16 0841 Oral     SpO2 06/26/16 0841 100 %     Weight 06/26/16 0840 176 lb (79.8 kg)     Height 06/26/16 0840 5\' 5"  (1.651 m)     Head Circumference --      Peak Flow --      Pain Score 06/26/16 0843 4     Pain Loc --      Pain Edu? --      Excl. in GC? --    No data found.   Updated Vital Signs BP 109/65 (BP Location: Left Arm)   Pulse 71  Temp 98.5 F (36.9 C) (Oral)   Resp 16   Ht 5\' 5"  (1.651 m)   Wt 176 lb (79.8 kg)   LMP 06/23/2016   SpO2 100%   BMI 29.29 kg/m   Visual Acuity Right Eye Distance:   Left Eye Distance:   Bilateral Distance:    Right Eye Near:   Left Eye Near:    Bilateral Near:     Physical Exam  Constitutional: She appears well-developed and well-nourished. No distress.  HENT:  Head: Normocephalic and atraumatic.  Right Ear: Tympanic membrane, external ear and ear canal normal.  Left Ear: Tympanic membrane, external ear and ear canal normal.  Nose: No mucosal edema, rhinorrhea, nose lacerations, sinus tenderness, nasal deformity, septal deviation or nasal septal hematoma. No epistaxis.  No foreign bodies. Right sinus exhibits no maxillary sinus tenderness and no frontal sinus tenderness. Left sinus exhibits no maxillary sinus tenderness and no  frontal sinus tenderness.  Mouth/Throat: Uvula is midline, oropharynx is clear and moist and mucous membranes are normal. No oropharyngeal exudate.  Eyes: Conjunctivae and EOM are normal. Pupils are equal, round, and reactive to light. Right eye exhibits no discharge. Left eye exhibits no discharge. No scleral icterus.  Neck: Normal range of motion. Neck supple. No thyromegaly present.  Cardiovascular: Normal rate, regular rhythm and normal heart sounds.   Pulmonary/Chest: Effort normal and breath sounds normal. No respiratory distress. She has no wheezes. She has no rales.  Lymphadenopathy:    She has no cervical adenopathy.  Skin: She is not diaphoretic.  Nursing note and vitals reviewed.    UC Treatments / Results  Labs (all labs ordered are listed, but only abnormal results are displayed) Labs Reviewed - No data to display  EKG  EKG Interpretation None       Radiology No results found.  Procedures Procedures (including critical care time)  Medications Ordered in UC Medications - No data to display   Initial Impression / Assessment and Plan / UC Course  I have reviewed the triage vital signs and the nursing notes.  Pertinent labs & imaging results that were available during my care of the patient were reviewed by me and considered in my medical decision making (see chart for details).  Clinical Course       Final Clinical Impressions(s) / UC Diagnoses   Final diagnoses:  Eustachian tube disorder, left    New Prescriptions Discharge Medication List as of 06/26/2016  9:08 AM     1. diagnosis reviewed with patient 2. Recommend supportive treatment with otc antihistamine-decongestant combination, flonase, otc analgesics 4. Recommend follow up with ENT specialist for further evaluation and management 5. Follow-up prn if symptoms worsen or don't improve   Payton Mccallumrlando Renne Platts, MD 06/26/16 [phone]

## 2016-06-26 NOTE — ED Triage Notes (Signed)
Patient c/o pain in her left ear for the past month.  Patient denies fevers.

## 2016-06-27 ENCOUNTER — Other Ambulatory Visit: Payer: Self-pay | Admitting: Internal Medicine

## 2016-06-27 ENCOUNTER — Telehealth: Payer: Self-pay

## 2016-06-27 ENCOUNTER — Other Ambulatory Visit: Payer: Self-pay

## 2016-06-27 DIAGNOSIS — H6983 Other specified disorders of Eustachian tube, bilateral: Secondary | ICD-10-CM

## 2016-06-27 MED ORDER — PREDNISONE 10 MG PO TABS: ORAL_TABLET | ORAL | 0 refills | Status: DC

## 2016-06-27 NOTE — Telephone Encounter (Signed)
Patient called for ENT. Advised [facility] may be faqster and she said it is January before she can be seen. Per Dr. Judithann GravesBerglund we will begin Prednisone Taper. Send to [facility]

## 2016-06-29 ENCOUNTER — Telehealth: Payer: Self-pay

## 2016-06-29 NOTE — Telephone Encounter (Signed)
Courtesy call back completed today for patient's recent visit at [facility]. Patient did not answer, left message on machine to call back with any questions or concerns.   

## 2016-10-06 ENCOUNTER — Ambulatory Visit (INDEPENDENT_AMBULATORY_CARE_PROVIDER_SITE_OTHER): Payer: BC Managed Care – PPO | Admitting: Internal Medicine

## 2016-10-06 ENCOUNTER — Encounter: Payer: Self-pay | Admitting: Internal Medicine

## 2016-10-06 VITALS — BP 98/62 | HR 78 | Ht 65.0 in | Wt 171.0 lb

## 2016-10-06 DIAGNOSIS — M62830 Muscle spasm of back: Secondary | ICD-10-CM | POA: Diagnosis not present

## 2016-10-06 DIAGNOSIS — B351 Tinea unguium: Secondary | ICD-10-CM

## 2016-10-06 MED ORDER — CYCLOBENZAPRINE HCL 10 MG PO TABS: 10.0000 mg | ORAL_TABLET | Freq: Three times a day (TID) | ORAL | 0 refills | Status: DC | PRN

## 2016-10-06 NOTE — Progress Notes (Signed)
Date:  10/06/2016   Name:  Kelly Schmidt   DOB:  December 28, 1970   MRN:  [id number]   Chief Complaint: Back Pain (Entire lower back hurts. Went to dance class and back stiffened up, and husband cracked back and felt some relief. But still painful when you sit for long periods of time.) and Ingrown Toenail (R) great toe nail is inflammed and hurts.) Back Pain  This is a recurrent problem. The current episode started in the past 7 days. The problem occurs constantly. The problem is unchanged. The pain is present in the sacro-iliac. The quality of the pain is described as cramping and aching. The pain does not radiate. The pain is mild. The pain is worse during the night (and after sitting). The symptoms are aggravated by twisting and sitting. Pertinent negatives include no bladder incontinence, bowel incontinence, chest pain, fever, numbness, paresthesias, pelvic pain or weakness.  Toe Pain   There was no injury mechanism. The pain is present in the right toes. The pain has been improving since onset. Pertinent negatives include no numbness. The symptoms are aggravated by weight bearing. Treatments tried: elevated the corners of the nail on the right great toe. The treatment provided moderate relief.    Review of Systems  Constitutional: Negative for fever.  Respiratory: Negative for chest tightness, shortness of breath and wheezing.   Cardiovascular: Negative for chest pain, palpitations and leg swelling.  Gastrointestinal: Negative for bowel incontinence.  Genitourinary: Negative for bladder incontinence and pelvic pain.  Musculoskeletal: Positive for back pain (and stiffness).  Skin:       Nail thickening and discoloration right great and second toe nails  Neurological: Negative for weakness, numbness and paresthesias.    Patient Active Problem List   Diagnosis Date Noted  . Lumbar disc disease 03/20/2015  . Excess, menstruation 03/24/2014  . 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  H/O deep venous thrombosis 12/30/2013  . 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 Aspirin Hives  . Lidocaine     Past Surgical History:  Procedure Laterality Date  . BUNIONECTOMY    . CRYOABLATION     heavy periods  . TONSILLECTOMY      Social History  Substance Use Topics  . Smoking status: Never Smoker  . Smokeless tobacco: Never Used  . Alcohol use 0.0 oz/week     Comment: occasional     Medication list has been reviewed and updated.   Physical Exam  Constitutional: She is oriented to person, place, and time. She appears well-developed. No distress.  HENT:  Head: Normocephalic and atraumatic.  Cardiovascular: Normal rate, regular rhythm and normal heart sounds.   Pulmonary/Chest: Effort normal and breath sounds normal. No respiratory distress.  Musculoskeletal: Normal range of motion.       Lumbar back: She exhibits spasm. She exhibits no tenderness.       Back:  SLR negative bilaterally  Neurological: She is alert and oriented to person, place, and time. She has normal strength and normal reflexes. No sensory deficit.  Skin: Skin is warm and dry. No rash noted.  Thickened toe nails on right great and second toe No ingrown nail, inflammation or tenderness  Psychiatric: She has a normal mood and affect. Her behavior is normal. Thought content normal.  Nursing note and vitals reviewed.   BP 98/62 (BP Location: Right Arm, Patient Position: Sitting, Cuff Size: Normal)  Pulse 78   Ht 5\' 5"  (1.651 m)   Wt 171 lb (77.6 kg)   LMP 09/07/2016 (Exact Date)   SpO2 98%   BMI 28.46 kg/m   Assessment and Plan: 1. Muscle spasm of back Continue heat and tylenol Add Flexeril at hs  2. Fungal nail infection Reassurance - no further treatment needed Consult Podiatry if nail bed becomes inflammed   Meds ordered this encounter  Medications  . cyclobenzaprine (FLEXERIL) 10 MG tablet    Sig:  Take 1 tablet (10 mg total) by mouth 3 (three) times daily as needed for muscle spasms.    Dispense:  30 tablet    Refill:  0    [name], MD [facility]  10/06/2016

## 2016-10-14 ENCOUNTER — Other Ambulatory Visit: Payer: Self-pay | Admitting: Internal Medicine

## 2016-10-14 ENCOUNTER — Telehealth: Payer: Self-pay

## 2016-10-14 MED ORDER — PREDNISONE 10 MG PO TABS: ORAL_TABLET | ORAL | 0 refills | Status: DC

## 2016-10-14 NOTE — Telephone Encounter (Signed)
Pt called stating Hives are worse and now in face/ lip is swollen. Said prednisone helped for this before. Would like prednisone sent to pharmacy.

## 2016-10-14 NOTE — Telephone Encounter (Signed)
Done

## 2016-10-15 ENCOUNTER — Encounter: Payer: Self-pay | Admitting: Emergency Medicine

## 2016-10-15 ENCOUNTER — Emergency Department
Admission: EM | Admit: 2016-10-15 | Discharge: 2016-10-15 | Disposition: A | Discharge status: Home / Self Care | Payer: BC Managed Care – PPO | Attending: Emergency Medicine | Admitting: Emergency Medicine

## 2016-10-15 DIAGNOSIS — T7840XA Allergy, unspecified, initial encounter: Secondary | ICD-10-CM | POA: Diagnosis not present

## 2016-10-15 MED ORDER — METHYLPREDNISOLONE SODIUM SUCC 125 MG IJ SOLR
100.0000 mg | Freq: Once | INTRAMUSCULAR | Status: AC
  Administered 2016-10-15: 100 mg via INTRAMUSCULAR
  Filled 2016-10-15: qty 2

## 2016-10-15 MED ORDER — DIPHENHYDRAMINE HCL 50 MG/ML IJ SOLN
25.0000 mg | Freq: Once | INTRAMUSCULAR | Status: AC
  Administered 2016-10-15: 25 mg via INTRAMUSCULAR
  Filled 2016-10-15: qty 1

## 2016-10-15 MED ORDER — RANITIDINE HCL 150 MG PO TABS: 150.0000 mg | ORAL_TABLET | Freq: Two times a day (BID) | ORAL | 0 refills | Status: DC

## 2016-10-15 NOTE — ED Provider Notes (Signed)
[facility] Emergency Department Provider Note  ____________________________________________  Time seen: Approximately 9:28 PM  I have reviewed the triage vital signs and the nursing notes.   HISTORY  Chief Complaint Allergic Reaction    HPI Kelly Schmidt is a 46 y.o. female that presents to the emergency department with allergic reaction for 2 weeks. Rash itches and is over her arms, back, legs. Patient states that this has happened several times in the past. Patient states that usually she can control it with prednisone and Benadryl. Patient was started on prednisone yesterday from her PCP. Patient has not had Benadryl since Thursday. 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 Patient states that she switched soap to [place] sensitive skin after reaction started. Patient has not changed laundry detergent. Patient denies fever, difficulty breathing, throat swelling, chest pain, nausea, vomiting, abdominal pain.   Past Medical History:  Diagnosis Date  . DVT (deep venous thrombosis) [facility])     Patient Active Problem List   Diagnosis Date Noted  . Lumbar disc disease 03/20/2015  . Excess, menstruation 03/24/2014  . 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 H/O deep venous thrombosis 12/30/2013  . 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 BUNIONECTOMY    . CRYOABLATION     heavy periods  . TONSILLECTOMY      Prior to Admission medications   Medication Sig Start Date End Date Taking? Authorizing Provider  cyclobenzaprine (FLEXERIL) 10 MG tablet Take 1 tablet (10 mg total) by mouth 3 (three) times daily as needed for muscle spasms. 10/06/16   Reubin Milan, MD  predniSONE (DELTASONE) 10 MG tablet Take 6 on day 1, 5 on day 2, 4 on day 3, 3 on day 4, 2 on day 5 and 1 on day 1 then stop. 10/14/16   Reubin Milan, MD   ranitidine (ZANTAC) 150 MG tablet Take 1 tablet (150 mg total) by mouth 2 (two) times daily. 10/15/16 10/15/17  Enid Derry, PA-C    Allergies Aspirin and Lidocaine  Family History  Problem Relation Age of Onset  . Hypertension Mother     Social History Social History  Substance Use Topics  . Smoking status: Never Smoker  . Smokeless tobacco: Never Used  . Alcohol use 0.0 oz/week     Comment: occasional     Review of Systems  Constitutional: No fever/chills Cardiovascular: No chest pain. Respiratory: No cough. No SOB. Gastrointestinal: No abdominal pain.  No nausea, no vomiting.  Musculoskeletal: Negative for musculoskeletal pain. Skin: Negative for abrasions, lacerations, ecchymosis. Neurological: Negative for headaches, numbness or tingling   ____________________________________________   PHYSICAL EXAM:  VITAL SIGNS: ED Triage Vitals  Enc Vitals Group     BP 10/15/16 2032 132/70     Pulse Rate 10/15/16 2032 85     Resp 10/15/16 2032 18     Temp 10/15/16 2032 98.2 F (36.8 C)     Temp Source 10/15/16 2032 Oral     SpO2 10/15/16 2032 100 %     Weight 10/15/16 2031 171 lb (77.6 kg)     Height 10/15/16 2031 5\' 5"  (1.651 m)     Head Circumference --      Peak Flow --      Pain Score --      Pain Loc --      Pain Edu? --  Excl. in GC? --      Constitutional: Alert and oriented. Well appearing and in no acute distress. Eyes: Conjunctivae are normal. PERRL. EOMI. Head: Atraumatic. ENT:      Ears:      Nose: No congestion/rhinnorhea.      Mouth/Throat: Mucous membranes are moist.  Neck: No stridor.   Cardiovascular: Normal rate, regular rhythm.  Good peripheral circulation. Respiratory: Normal respiratory effort without tachypnea or retractions. Lungs CTAB. Good air entry to the bases with no decreased or absent breath sounds. Musculoskeletal: Full range of motion to all extremities. No gross deformities appreciated. Neurologic:  Normal speech and  language. No gross focal neurologic deficits are appreciated.  Skin:  Skin is warm, dry and intact. Wheals over left arm, right back, and the back of both legs.  Psychiatric: Mood and affect are normal. Speech and behavior are normal. Patient exhibits appropriate insight and judgement.   ____________________________________________   LABS (all labs ordered are listed, but only abnormal results are displayed)  Labs Reviewed - No data to display ____________________________________________  EKG   ____________________________________________  RADIOLOGY   No results found.  ____________________________________________    PROCEDURES  Procedure(s) performed:    Procedures    Medications  methylPREDNISolone sodium succinate (SOLU-MEDROL) 125 mg/2 mL injection 100 mg (100 mg Intramuscular Given 10/15/16 2154)  diphenhydrAMINE (BENADRYL) injection 25 mg (25 mg Intramuscular Given 10/15/16 2152)     ____________________________________________   INITIAL IMPRESSION / ASSESSMENT AND PLAN / ED COURSE  Pertinent labs & imaging results that were available during my care of the patient were reviewed by me and considered in my medical decision making (see chart for details).  Review of the [place] CSRS was performed in accordance of the [facility] prior to dispensing any controlled drugs.     Patient's diagnosis is consistent with allergic reaction. Vital signs and exam are reassuring. No indication of anaphylaxis. Patient received prednisone injection and Benadryl injection in ED. Patient has a driver. Patient will continue taking prednisone prescribed by PCP. Patient will also begin taking Benadryl and famotidine. Patient will be discharged home with prescriptions for famotidine. Patient is to follow up with PCP as directed. Patient is given ED precautions to return to the ED for any worsening or new symptoms.     ____________________________________________  FINAL CLINICAL  IMPRESSION(S) / ED DIAGNOSES  Final diagnoses:  Allergic reaction, initial encounter      NEW MEDICATIONS STARTED DURING THIS VISIT:  Discharge Medication List as of 10/15/2016 10:46 PM    START taking these medications   Details  ranitidine (ZANTAC) 150 MG tablet Take 1 tablet (150 mg total) by mouth 2 (two) times daily., Starting Sat 10/15/2016, Until Sun 10/15/2017, Print            This chart was dictated using voice recognition software/Dragon. Despite best efforts to proofread, errors can occur which can change the meaning. Any change was purely unintentional.    Enid DerryAshley Samariah Hokenson, PA-C 10/15/16 2329    Merrily BrittleNeil Rifenbark, MD 10/16/16 [phone]

## 2016-10-15 NOTE — ED Triage Notes (Signed)
Patient states that she has had an allergic reaction times two weeks. Patient with rash to arms and chest. Patient states that she was started on prednisone yesterday.

## 2016-10-15 NOTE — ED Notes (Signed)
Pt reports she noticed on 10/03/16 that she developed a rash on herl body with hives. Pt reports on Thursday she saw her PCP who prescribed her prednisone which she started on Friday. Pt reports not getting any better. Pt also reports occasionally feeling short winded at times. Resp even and unlabored at this time and no wheezing noted to ascultation. Pt is no longer taking benadryl.

## 2016-10-17 ENCOUNTER — Telehealth: Payer: Self-pay

## 2016-10-17 ENCOUNTER — Other Ambulatory Visit: Payer: Self-pay | Admitting: Internal Medicine

## 2016-10-17 DIAGNOSIS — T783XXD Angioneurotic edema, subsequent encounter: Secondary | ICD-10-CM

## 2016-10-17 NOTE — Telephone Encounter (Signed)
Referral placed to [facility].

## 2016-10-17 NOTE — Telephone Encounter (Signed)
Pt called stating she had to be seen in ED for hives and swelling. Was told to take prednisone and benadryl. Pt states she broke out in hives again last night and would like a referral to see a allergy specialist for this recurrent problem.

## 2016-10-17 NOTE — Telephone Encounter (Signed)
Pt informed

## 2016-12-28 ENCOUNTER — Other Ambulatory Visit: Payer: Self-pay | Admitting: Internal Medicine

## 2016-12-29 ENCOUNTER — Other Ambulatory Visit: Payer: Self-pay | Admitting: Internal Medicine

## 2016-12-29 ENCOUNTER — Encounter: Payer: Self-pay | Admitting: Internal Medicine

## 2016-12-29 ENCOUNTER — Ambulatory Visit (INDEPENDENT_AMBULATORY_CARE_PROVIDER_SITE_OTHER): Payer: BC Managed Care – PPO | Admitting: Internal Medicine

## 2016-12-29 VITALS — BP 112/64 | HR 70 | Ht 65.0 in | Wt 172.0 lb

## 2016-12-29 DIAGNOSIS — N289 Disorder of kidney and ureter, unspecified: Secondary | ICD-10-CM

## 2016-12-29 DIAGNOSIS — L509 Urticaria, unspecified: Secondary | ICD-10-CM | POA: Diagnosis not present

## 2016-12-29 DIAGNOSIS — L501 Idiopathic urticaria: Secondary | ICD-10-CM | POA: Insufficient documentation

## 2016-12-29 NOTE — Progress Notes (Signed)
Date:  12/29/2016   Name:  Kelly Schmidt   DOB:  29-Mar-1971   MRN:  [id number]   Chief Complaint: Abnormal Lab ([facility] has been [facility] past 2 lab tests. Wanted to see PCP to discuss. )  10/2016 - Cr 1.3, bun 14, GFR 53  11/2013 - Cr 1.2, bun14, GFR 59  05/2013 - Cr 0.9, bun 14, GRF > 60  Review of Systems  Constitutional: Negative for chills, fatigue and fever.  Respiratory: Negative for chest tightness and shortness of breath.   Cardiovascular: Negative for chest pain and leg swelling.  Gastrointestinal: Negative for abdominal pain.  Genitourinary: Negative for dysuria, flank pain, frequency, hematuria and urgency.  Musculoskeletal: Negative for arthralgias.  Psychiatric/Behavioral: Negative for sleep disturbance.    Patient Active Problem List   Diagnosis Date Noted  . Lumbar disc disease 03/20/2015  . Excess, menstruation 03/24/2014  . 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 H/O deep venous thrombosis 12/30/2013  . 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 Authorizing Provider  cetirizine (ZYRTEC) 10 MG tablet Take 10 mg by mouth daily.   Yes [provider]  diphenhydrAMINE (BENADRYL) 50 MG capsule Take 50 mg by mouth every 6 (six) hours as needed.   Yes [provider]  fexofenadine (ALLEGRA) 30 MG tablet Take 30 mg by mouth 2 (two) times daily.   Yes [provider]  predniSONE (DELTASONE) 10 MG tablet Take 10 mg by mouth daily with breakfast.   Yes [provider]  ranitidine (ZANTAC) 150 MG capsule Take 150 mg by mouth 2 (two) times daily.   Yes [provider]    Allergies  Allergen Reactions  . Aspirin Hives    Past Surgical History:  Procedure Laterality Date  . BUNIONECTOMY    . CRYOABLATION     heavy periods  . TONSILLECTOMY      Social History  Substance Use Topics  . Smoking status: Never  Smoker  . Smokeless tobacco: Never Used  . Alcohol use 0.0 oz/week     Comment: occasional     Medication list has been reviewed and updated.   Physical Exam  Constitutional: She is oriented to person, place, and time. She appears well-developed. No distress.  HENT:  Head: Normocephalic and atraumatic.  Cardiovascular: Normal rate, regular rhythm and normal heart sounds.   Pulmonary/Chest: Effort normal and breath sounds normal. No respiratory distress. She has no wheezes.  Abdominal: Soft. Bowel sounds are normal. There is no CVA tenderness.  Musculoskeletal: She exhibits no edema.  Neurological: She is alert and oriented to person, place, and time.  Skin: Skin is warm and dry. No rash noted.  Psychiatric: She has a normal mood and affect. Her speech is normal and behavior is normal. Thought content normal.  Nursing note and vitals reviewed.   BP 112/64   Pulse 70   Ht 5\' 5"  (1.651 m)   Wt 172 lb (78 kg)   LMP 11/18/2016 Comment: Overdue for this by 13 days.   SpO2 98%   BMI 28.62 kg/m   Assessment and Plan: 1. Renal insufficiency meds reviewed, avoid nsaids Suspect elevated CR due to increased lean muscle mass - Basic metabolic panel - Microalbumin / creatinine urine ratio  2. Urticaria Being follow by specialist   No orders of the defined types were placed in this encounter.   Bari EdwardLaura Ariq Khamis, MD [facility]  12/29/2016

## 2016-12-29 NOTE — Patient Instructions (Signed)
Avoid prolonged high doses of Advil or Aleve or similar.

## 2016-12-30 ENCOUNTER — Ambulatory Visit: Payer: BC Managed Care – PPO | Admitting: Internal Medicine

## 2016-12-30 LAB — BASIC METABOLIC PANEL
BUN/Creatinine Ratio: 14 (ref 9–23)
BUN: 16 mg/dL (ref 6–24)
CHLORIDE: 100 mmol/L (ref 96–106)
CO2: 23 mmol/L (ref 18–29)
CREATININE: 1.15 mg/dL — AB (ref 0.57–1.00)
Calcium: 9.2 mg/dL (ref 8.7–10.2)
GFR calc Af Amer: 66 mL/min/{1.73_m2} (ref >59)
GFR calc non Af Amer: 57 mL/min/{1.73_m2} — ABNORMAL LOW (ref >59)
GLUCOSE: 79 mg/dL (ref 65–99)
Potassium: 4.4 mmol/L (ref 3.5–5.2)
Sodium: 138 mmol/L (ref 134–144)

## 2017-01-02 LAB — MICROALBUMIN / CREATININE URINE RATIO
CREATININE, UR: 140.5 mg/dL
Microalb/Creat Ratio: <2.1 mg/g creat (ref 0.0–30.0)

## 2017-02-27 ENCOUNTER — Telehealth: Payer: Self-pay

## 2017-02-27 ENCOUNTER — Other Ambulatory Visit: Payer: Self-pay | Admitting: Internal Medicine

## 2017-02-27 MED ORDER — FLUCONAZOLE 100 MG PO TABS: 100.0000 mg | ORAL_TABLET | Freq: Every day | ORAL | 0 refills | Status: AC

## 2017-02-27 NOTE — Telephone Encounter (Signed)
Pt informed meds sent in.

## 2017-02-27 NOTE — Telephone Encounter (Signed)
Patient called stating she has been taking prednisone for a while now due to hives. Saying she believes she has a yeast infection because of this. Wants to know if we can send something into pharmacy for relief. Please Advise. Pharmacy- [facility] in [place].

## 2017-02-27 NOTE — Telephone Encounter (Signed)
Sent diflucan to take daily x 3 days.

## 2017-07-27 ENCOUNTER — Other Ambulatory Visit: Payer: Self-pay

## 2017-07-27 ENCOUNTER — Encounter: Payer: Self-pay | Admitting: *Deleted

## 2017-07-27 ENCOUNTER — Ambulatory Visit
Admission: EM | Admit: 2017-07-27 | Discharge: 2017-07-27 | Disposition: A | Discharge status: Home / Self Care | Payer: BC Managed Care – PPO | Attending: Family Medicine | Admitting: Family Medicine

## 2017-07-27 ENCOUNTER — Other Ambulatory Visit: Payer: Self-pay | Admitting: Obstetrics and Gynecology

## 2017-07-27 DIAGNOSIS — J069 Acute upper respiratory infection, unspecified: Secondary | ICD-10-CM

## 2017-07-27 DIAGNOSIS — R05 Cough: Secondary | ICD-10-CM | POA: Diagnosis not present

## 2017-07-27 DIAGNOSIS — Z1231 Encounter for screening mammogram for malignant neoplasm of breast: Secondary | ICD-10-CM

## 2017-07-27 LAB — RAPID STREP SCREEN (MED CTR MEBANE ONLY): Streptococcus, Group A Screen (Direct): NEGATIVE

## 2017-07-27 MED ORDER — FLUTICASONE PROPIONATE 50 MCG/ACT NA SUSP: 2.0000 | Freq: Every day | NASAL | 0 refills | Status: DC

## 2017-07-27 MED ORDER — BENZONATATE 200 MG PO CAPS: ORAL_CAPSULE | ORAL | 0 refills | Status: DC

## 2017-07-27 MED ORDER — ALBUTEROL SULFATE HFA 108 (90 BASE) MCG/ACT IN AERS: 1.0000 | INHALATION_SPRAY | Freq: Four times a day (QID) | RESPIRATORY_TRACT | 0 refills | Status: AC | PRN

## 2017-07-27 MED ORDER — HYDROCOD POLST-CPM POLST ER 10-8 MG/5ML PO SUER: 5.0000 mL | Freq: Two times a day (BID) | ORAL | 0 refills | Status: DC

## 2017-07-27 NOTE — ED Provider Notes (Addendum)
[facility]    CSN: [id number] Arrival date & time: 07/27/17  1152     History   Chief Complaint Chief Complaint  Patient presents with  . Nasal Congestion  . Sore Throat  . Cough    HPI Kelly Schmidt is a 47 y.o. female.   HPI  A 47 year old female presents with 4-day history of sore throat cough nasal congestion bilateral ear pain and fever.  Been running a low-grade fever. At  Her OB office today and the OB felt that she had wheezing, a low-grade fever and lymph node swelling in her neck and recommended she come to the urgent care.  She has tried over-the-counter medication which has not been helpful.       Past Medical History:  Diagnosis Date  . DVT (deep venous thrombosis) [facility])     Patient Active Problem List   Diagnosis Date Noted  . Urticaria 12/29/2016  . Renal insufficiency 12/29/2016  . Lumbar disc disease 03/20/2015  . Excess, menstruation 03/24/2014  . 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 H/O deep venous thrombosis 12/30/2013  . 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 BUNIONECTOMY    . CRYOABLATION     heavy periods  . TONSILLECTOMY      OB History    No data available       Home Medications    Prior to Admission medications   Medication Sig Start Date End Date Taking? Authorizing Provider  cetirizine (ZYRTEC) 10 MG tablet Take 10 mg by mouth daily.   Yes [provider]  diphenhydrAMINE (BENADRYL) 50 MG capsule Take 50 mg by mouth every 6 (six) hours as needed.   Yes [provider]  levocetirizine (XYZAL) 5 MG tablet Take 5 mg by mouth every evening.   Yes [provider]  predniSONE (DELTASONE) 10 MG tablet Take 10 mg by mouth daily with breakfast.   Yes [provider]  ranitidine (ZANTAC) 150 MG capsule Take 150 mg by mouth 2 (two) times daily.   Yes [provider]  albuterol (PROVENTIL HFA;VENTOLIN HFA) 108 (90 Base)  MCG/ACT inhaler Inhale 1-2 puffs into the lungs every 6 (six) hours as needed for wheezing or shortness of breath. Use with spacer 07/27/17   Lutricia Feil, PA-C  benzonatate (TESSALON) 200 MG capsule Take one cap TID PRN cough 07/27/17   Lutricia Feil, PA-C  chlorpheniramine-HYDROcodone [facility]) 10-8 MG/5ML SUER Take 5 mLs by mouth 2 (two) times daily. 07/27/17   Lutricia Feil, PA-C  fluticasone (FLONASE) 50 MCG/ACT nasal spray Place 2 sprays into both nostrils daily. 07/27/17   Lutricia Feil, PA-C    Family History Family History  Problem Relation Age of Onset  . Hypertension Mother     Social History Social History   Tobacco Use  . Smoking status: Never Smoker  . Smokeless tobacco: Never Used  Substance Use Topics  . Alcohol use: Yes    Alcohol/week: 0.0 oz    Comment: occasional  . Drug use: No     Allergies   Aspirin   Review of Systems Review of Systems  Constitutional: Positive for activity change, chills, fatigue and fever.  HENT: Positive for congestion, ear pain, postnasal drip, rhinorrhea and sore throat.   Respiratory: Positive for cough, shortness of breath and wheezing.   All other systems reviewed and are negative.    Physical Exam Triage Vital Signs ED Triage Vitals  Enc Vitals Group     BP 07/27/17 1208 136/74     Pulse Rate 07/27/17 1208 95     Resp 07/27/17 1208 18     Temp 07/27/17 1208 99.8 F (37.7 C)     Temp Source 07/27/17 1208 Oral     SpO2 07/27/17 1208 98 %     Weight 07/27/17 1209 193 lb (87.5 kg)     Height 07/27/17 1209 5\' 5"  (1.651 m)     Head Circumference --      Peak Flow --      Pain Score 07/27/17 1211 0     Pain Loc --      Pain Edu? --      Excl. in GC? --    No data found.  Updated Vital Signs BP 136/74 (BP Location: Left Arm)   Pulse 95   Temp 99.8 F (37.7 C) (Oral)   Resp 18   Ht 5\' 5"  (1.651 m)   Wt 193 lb (87.5 kg)   LMP 06/26/2017   SpO2 98%   BMI 32.12 kg/m   Visual  Acuity Right Eye Distance:   Left Eye Distance:   Bilateral Distance:    Right Eye Near:   Left Eye Near:    Bilateral Near:     Physical Exam  Constitutional: She is oriented to person, place, and time. She appears well-developed and well-nourished.  Non-toxic appearance. She does not appear ill. No distress.  HENT:  Head: Normocephalic.  Right Ear: Hearing, tympanic membrane and ear canal normal.  Left Ear: Hearing, tympanic membrane and ear canal normal.  Mouth/Throat: Oropharynx is clear and moist and mucous membranes are normal. No oropharyngeal exudate, posterior oropharyngeal edema, posterior oropharyngeal erythema or tonsillar abscesses. Tonsils are 0 on the right. Tonsils are 0 on the left.  Eyes: Pupils are equal, round, and reactive to light.  Neck: Normal range of motion.  Pulmonary/Chest: Effort normal and breath sounds normal.  Lymphadenopathy:    She has cervical adenopathy.  Neurological: She is alert and oriented to person, place, and time.  Skin: Skin is warm and dry.  Psychiatric: She has a normal mood and affect. Her behavior is normal.  Nursing note and vitals reviewed.    UC Treatments / Results  Labs (all labs ordered are listed, but only abnormal results are displayed) Labs Reviewed  RAPID STREP SCREEN (NOT AT [facility])  CULTURE, GROUP A STREP [facility]    EKG  EKG Interpretation None       Radiology No results found.  Procedures Procedures (including critical care time)  Medications Ordered in UC Medications - No data to display   Initial Impression / Assessment and Plan / UC Course  I have reviewed the triage vital signs and the nursing notes.  Pertinent labs & imaging results that were available during my care of the patient were reviewed by me and considered in my medical decision making (see chart for details).     Plan: 1. Test/x-ray results and diagnosis reviewed with patient 2. rx as per orders; risks, benefits, potential side  effects reviewed with patient 3. Recommend supportive treatment with rest and fluids.  Tylenol for aches and fever.-year-old for shortness of breath and congestion.  If you are not feeling better follow-up with your primary care physician or may return to our clinic 4. F/u prn if symptoms worsen or don't improve   Final Clinical Impressions(s) / UC Diagnoses   Final diagnoses:  Upper respiratory tract infection, unspecified  type    ED Discharge Orders        Ordered    albuterol (PROVENTIL HFA;VENTOLIN HFA) 108 (90 Base) MCG/ACT inhaler  Every 6 hours PRN    Comments:  Provide spacer and instructions to patient   07/27/17 1257    benzonatate (TESSALON) 200 MG capsule     07/27/17 1257    chlorpheniramine-HYDROcodone (TUSSIONEX PENNKINETIC ER) 10-8 MG/5ML SUER  2 times daily     07/27/17 1257    fluticasone (FLONASE) 50 MCG/ACT nasal spray  Daily     07/27/17 1257       Controlled Substance Prescriptions [place] Controlled Substance Registry consulted? Not Applicable   Lutricia Feil, PA-C 07/27/17 1312    Lutricia Feil, PA-C 07/27/17 1313

## 2017-07-27 NOTE — ED Triage Notes (Addendum)
Patient started having symptoms of sore throat, cough, nasal congestion, bilateral ear pain, and fever 4 days ago.

## 2017-07-29 LAB — CULTURE, GROUP A STREP (THRC)

## 2017-08-15 ENCOUNTER — Ambulatory Visit
Admission: RE | Admit: 2017-08-15 | Discharge: 2017-08-15 | Disposition: A | Discharge status: Home / Self Care | Payer: BC Managed Care – PPO | Source: Ambulatory Visit | Attending: Obstetrics and Gynecology | Admitting: Obstetrics and Gynecology

## 2017-08-15 DIAGNOSIS — Z1231 Encounter for screening mammogram for malignant neoplasm of breast: Secondary | ICD-10-CM | POA: Diagnosis not present

## 2017-09-07 ENCOUNTER — Encounter: Payer: Self-pay | Admitting: Internal Medicine

## 2017-09-07 NOTE — Telephone Encounter (Signed)
Please Advise Mychart message. Should patient be seen for appt or any advice to give her?

## 2017-11-25 ENCOUNTER — Other Ambulatory Visit: Payer: Self-pay | Admitting: Internal Medicine

## 2017-11-25 DIAGNOSIS — N939 Abnormal uterine and vaginal bleeding, unspecified: Secondary | ICD-10-CM | POA: Insufficient documentation

## 2017-11-25 DIAGNOSIS — L501 Idiopathic urticaria: Secondary | ICD-10-CM

## 2017-11-28 ENCOUNTER — Ambulatory Visit (INDEPENDENT_AMBULATORY_CARE_PROVIDER_SITE_OTHER): Payer: BC Managed Care – PPO | Admitting: Internal Medicine

## 2017-11-28 ENCOUNTER — Encounter: Payer: Self-pay | Admitting: Internal Medicine

## 2017-11-28 DIAGNOSIS — M545 Low back pain, unspecified: Secondary | ICD-10-CM | POA: Insufficient documentation

## 2017-11-28 DIAGNOSIS — M9981 Other biomechanical lesions of cervical region: Secondary | ICD-10-CM | POA: Diagnosis not present

## 2017-11-28 DIAGNOSIS — M4802 Spinal stenosis, cervical region: Secondary | ICD-10-CM

## 2017-11-28 MED ORDER — CYCLOBENZAPRINE HCL 10 MG PO TABS: 10.0000 mg | ORAL_TABLET | Freq: Three times a day (TID) | ORAL | 0 refills | Status: DC | PRN

## 2017-11-28 NOTE — Progress Notes (Signed)
Date:  11/28/2017   Name:  Kelly Schmidt   DOB:  05-21-1971   MRN:  [id number]   Chief Complaint: Back Pain (started in the lower back but has moved more to the top and down the L) arm is tingling)  Back Pain  This is a recurrent problem. The current episode started 1 to 4 weeks ago. The problem occurs constantly. The problem has been gradually improving since onset. The pain is present in the lumbar spine. The quality of the pain is described as aching. The pain does not radiate. The symptoms are aggravated by bending, twisting and stress. Associated symptoms include numbness. Pertinent negatives include no abdominal pain, chest pain, fever, headaches or weakness. She has tried muscle relaxant for the symptoms. The treatment provided mild relief.  Neck Pain   This is a new problem. The current episode started 1 to 4 weeks ago. The problem has been gradually worsening. The pain is associated with nothing. The pain is present in the right side. Quality: numbness in fingers of right hand. Associated symptoms include numbness. Pertinent negatives include no chest pain, fever, headaches or weakness. She has tried muscle relaxants for the symptoms. The treatment provided mild relief.  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 CT C-spine 2015: No acute fracture. Alignment of the cervical spine is normal. Minimal retrolisthesis of C5 on C6. The occipital condyles are normal in appearance. No prevertebral soft tissue swelling.  C1-2: No significant degenerative change. C2-3: No significant degenerative change. C3-4: No significant degenerative change. C4-5: No significant degenerative change. C5-6:Severe right and moderate left foraminal narrowing. Mild canal stenosis. Mild height loss of C5. C6-7: No significant degenerative change. C7-T1: No significant degenerative change.  Review of Systems  Constitutional: Negative for chills, fatigue and fever.  Respiratory: Negative  for chest tightness and shortness of breath.   Cardiovascular: Negative for chest pain and palpitations.  Gastrointestinal: Negative for abdominal pain.  Musculoskeletal: Positive for back pain and neck pain.  Neurological: Positive for numbness. Negative for dizziness, weakness and headaches.    Patient Active Problem List   Diagnosis Date Noted  . 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 Low back pain 11/28/2017  . Abnormal uterine bleeding (AUB) 11/25/2017  . 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 Renal insufficiency 12/29/2016  . Lumbar disc disease 03/20/2015  . 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 H/O deep venous thrombosis 12/30/2013  . 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 Authorizing Provider  albuterol (PROVENTIL HFA;VENTOLIN HFA) 108 (90 Base) MCG/ACT inhaler Inhale 1-2 puffs into the lungs every 6 (six) hours as needed for wheezing or shortness of breath. Use with spacer 07/27/17  Yes Lutricia Feil, PA-C  cetirizine (ZYRTEC) 10 MG tablet Take 10 mg by mouth daily.   Yes [provider]  dapsone 100 MG tablet Take 1 tablet by mouth daily. 08/04/17  Yes [provider]  diphenhydrAMINE (BENADRYL) 50 MG capsule Take 50 mg by mouth every 6 (six) hours as needed.   Yes [provider]  fluticasone (FLONASE) 50 MCG/ACT nasal spray Place 2 sprays into both nostrils daily. 07/27/17  Yes Lutricia Feil, PA-C  levocetirizine (XYZAL) 5 MG tablet Take 5 mg by mouth every evening.   Yes [provider]  ranitidine (ZANTAC) 150 MG capsule Take 150 mg by mouth 2 (two) times daily.   Yes [provider]  predniSONE (DELTASONE) 10  MG tablet Take 10 mg by mouth daily with breakfast.    [provider]    Allergies  Allergen Reactions  . Aspirin Hives    Past Surgical History:  Procedure Laterality Date  . BUNIONECTOMY    . CRYOABLATION      heavy periods  . TONSILLECTOMY      Social History   Tobacco Use  . Smoking status: Never Smoker  . Smokeless tobacco: Never Used  Substance Use Topics  . Alcohol use: Yes    Alcohol/week: 0.0 oz    Comment: occasional  . Drug use: No     Medication list has been reviewed and updated.  PHQ 2/9 Scores 11/28/2017  PHQ - 2 Score 0    Physical Exam  Constitutional: She is oriented to person, place, and time. She appears well-developed. No distress.  HENT:  Head: Normocephalic and atraumatic.  Cardiovascular: Normal rate, regular rhythm and normal heart sounds.  Pulmonary/Chest: Effort normal and breath sounds normal. No respiratory distress.  Musculoskeletal:       Cervical back: She exhibits decreased range of motion. She exhibits no tenderness and no bony tenderness.       Lumbar back: She exhibits tenderness and spasm.  Neurological: She is alert and oriented to person, place, and time. She has normal strength. No sensory deficit.  Reflex Scores:      Bicep reflexes are 2+ on the right side and 2+ on the left side.      Patellar reflexes are 2+ on the right side and 2+ on the left side. Skin: Skin is warm and dry. No rash noted.  Psychiatric: She has a normal mood and affect. Her behavior is normal. Thought content normal.    BP 120/80   Pulse 72   Ht  (1.651 m)   Wt 188 lb (85.3 kg)   LMP 11/22/2017 (Exact Date)   BMI 31.28 kg/m   Assessment and Plan: 1. Foraminal stenosis of cervical region Flexeril and tylenol plus heat/ice May need to see Neurosurgery if persistent/worsening - NO chiropractice manipultation  2. Acute right-sided low back pain without sciatica Flexeril and tylenol May try chiropractic care   Meds ordered this encounter  Medications  . cyclobenzaprine (FLEXERIL) 10 MG tablet    Sig: Take 1 tablet (10 mg total) by mouth 3 (three) times daily as needed for muscle spasms.    Dispense:  90 tablet    Refill:  0    Partially dictated  using Animal nutritionist. Any errors are unintentional.  Bari Edward, MD [facility]  11/28/2017

## 2018-01-23 ENCOUNTER — Ambulatory Visit
Admission: RE | Admit: 2018-01-23 | Discharge: 2018-01-23 | Disposition: A | Discharge status: Home / Self Care | Payer: BC Managed Care – PPO | Source: Ambulatory Visit | Attending: Internal Medicine | Admitting: Internal Medicine

## 2018-01-23 ENCOUNTER — Other Ambulatory Visit: Payer: Self-pay | Admitting: Internal Medicine

## 2018-01-23 ENCOUNTER — Ambulatory Visit (INDEPENDENT_AMBULATORY_CARE_PROVIDER_SITE_OTHER): Payer: BC Managed Care – PPO | Admitting: Internal Medicine

## 2018-01-23 ENCOUNTER — Encounter: Payer: Self-pay | Admitting: Internal Medicine

## 2018-01-23 VITALS — BP 126/80 | HR 105 | Temp 98.1°F | Resp 16 | Ht 65.0 in | Wt 188.0 lb

## 2018-01-23 DIAGNOSIS — L501 Idiopathic urticaria: Secondary | ICD-10-CM | POA: Diagnosis not present

## 2018-01-23 DIAGNOSIS — M545 Low back pain: Secondary | ICD-10-CM | POA: Diagnosis not present

## 2018-01-23 DIAGNOSIS — G8929 Other chronic pain: Secondary | ICD-10-CM

## 2018-01-23 DIAGNOSIS — M519 Unspecified thoracic, thoracolumbar and lumbosacral intervertebral disc disorder: Secondary | ICD-10-CM

## 2018-01-23 NOTE — Progress Notes (Signed)
Date:  01/23/2018   Name:  Kelly Schmidt   DOB:  06-Apr-1971   MRN:  [id number]   Chief Complaint: Back Pain (LBP) and Hip Pain (right side ) Back Pain  This is a recurrent problem. The problem occurs constantly. The problem has been gradually worsening since onset. The pain is present in the lumbar spine. The quality of the pain is described as aching. The pain radiates to the right thigh. The pain is mild. The symptoms are aggravated by sitting, twisting and bending. Pertinent negatives include no abdominal pain, bladder incontinence, bowel incontinence, chest pain, leg pain, numbness, paresis, perianal numbness or weakness. She has tried muscle relaxant and NSAIDs for the symptoms. The treatment provided mild relief.  Hip Pain   There was no injury mechanism. The pain is present in the right hip. The quality of the pain is described as aching. The pain is mild. Pertinent negatives include no numbness. She has tried NSAIDs and rest for the symptoms.   She was in the sun over the past week and noticed that her face became very red and broke out in tiny red papules.  She did not have any hives.  She is on several medications that may be associated with sun sensitivity.   Review of Systems  Constitutional: Negative for chills and fatigue.  Respiratory: Negative for chest tightness and shortness of breath.   Cardiovascular: Negative for chest pain.  Gastrointestinal: Negative for abdominal pain, bowel incontinence, constipation and diarrhea.  Genitourinary: Negative for bladder incontinence.  Musculoskeletal: Positive for back pain. Negative for gait problem, joint swelling and myalgias.  Neurological: Negative for weakness and numbness.    Patient Active Problem List   Diagnosis Date Noted  . 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 Low back pain 11/28/2017  . Abnormal uterine bleeding (AUB) 11/25/2017  . 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 Renal insufficiency  12/29/2016  . Lumbar disc disease 03/20/2015  . 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 H/O deep venous thrombosis 12/30/2013  . 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 Authorizing Provider  albuterol (PROVENTIL HFA;VENTOLIN HFA) 108 (90 Base) MCG/ACT inhaler Inhale 1-2 puffs into the lungs every 6 (six) hours as needed for wheezing or shortness of breath. Use with spacer 07/27/17  Yes Lutricia Feil, PA-C  cetirizine (ZYRTEC) 10 MG tablet Take 10 mg by mouth daily.   Yes [provider]  cyclobenzaprine (FLEXERIL) 10 MG tablet Take 1 tablet (10 mg total) by mouth 3 (three) times daily as needed for muscle spasms. 11/28/17  Yes Reubin Milan, MD  dapsone 100 MG tablet Take 1 tablet by mouth daily. 08/04/17  Yes [provider]  diphenhydrAMINE (BENADRYL) 50 MG capsule Take 50 mg by mouth every 6 (six) hours as needed.   Yes [provider]  fluticasone (FLONASE) 50 MCG/ACT nasal spray Place 2 sprays into both nostrils daily. 07/27/17  Yes Lutricia Feil, PA-C  levocetirizine (XYZAL) 5 MG tablet Take 5 mg by mouth every evening.   Yes [provider]  omalizumab Geoffry Paradise) 150 MG injection Inject into the skin every 28 (twenty-eight) days.   Yes [provider]  ranitidine (ZANTAC) 150 MG capsule Take 150 mg by mouth 2 (two) times daily.   Yes [provider]    Allergies  Allergen Reactions  . Aspirin Hives    Past Surgical History:  Procedure Laterality Date  .  BUNIONECTOMY    . CRYOABLATION     heavy periods  . TONSILLECTOMY      Social History   Tobacco Use  . Smoking status: Never Smoker  . Smokeless tobacco: Never Used  Substance Use Topics  . Alcohol use: Yes    Alcohol/week: 0.0 oz    Comment: occasional  . Drug use: No     Medication list has been reviewed and updated.  Current Meds  Medication Sig  . albuterol (PROVENTIL HFA;VENTOLIN HFA)  108 (90 Base) MCG/ACT inhaler Inhale 1-2 puffs into the lungs every 6 (six) hours as needed for wheezing or shortness of breath. Use with spacer  . cetirizine (ZYRTEC) 10 MG tablet Take 10 mg by mouth daily.  . cyclobenzaprine (FLEXERIL) 10 MG tablet Take 1 tablet (10 mg total) by mouth 3 (three) times daily as needed for muscle spasms.  . dapsone 100 MG tablet Take 1 tablet by mouth daily.  . diphenhydrAMINE (BENADRYL) 50 MG capsule Take 50 mg by mouth every 6 (six) hours as needed.  . fluticasone (FLONASE) 50 MCG/ACT nasal spray Place 2 sprays into both nostrils daily.  Marland Kitchen. levocetirizine (XYZAL) 5 MG tablet Take 5 mg by mouth every evening.  Marland Kitchen. omalizumab (XOLAIR) 150 MG injection Inject into the skin every 28 (twenty-eight) days.  . ranitidine (ZANTAC) 150 MG capsule Take 150 mg by mouth 2 (two) times daily.    PHQ 2/9 Scores 11/28/2017  PHQ - 2 Score 0    Physical Exam  Constitutional: She is oriented to person, place, and time. She appears well-developed. No distress.  HENT:  Head: Normocephalic and atraumatic.  Cardiovascular: Normal rate, regular rhythm and normal heart sounds.  Pulmonary/Chest: Effort normal and breath sounds normal. No respiratory distress.  Musculoskeletal: Normal range of motion.       Right hip: Normal.       Left hip: Normal.       Lumbar back: She exhibits no tenderness, no bony tenderness and no spasm.  Neurological: She is alert and oriented to person, place, and time.  Skin: Skin is warm and dry. No rash noted.  Psychiatric: She has a normal mood and affect. Her behavior is normal. Thought content normal.  Nursing note and vitals reviewed.   BP 126/80   Pulse (!) 105   Temp 98.1 F (36.7 C) (Oral)   Resp 16   Ht 5\' 5"  (1.651 m)   Wt 188 lb (85.3 kg)   LMP 12/15/2017   SpO2 97%   BMI 31.28 kg/m   Assessment and Plan: 1. Chronic midline low back pain, with sciatica presence unspecified Will need xrays and probably Ortho referral Continue  nsaids and flexeril - DG Lumbar Spine Complete; Future  2. Chronic idiopathic urticaria Avoid sun exposure  Follow up with Allergy specialist   No orders of the defined types were placed in this encounter.   Partially dictated using Animal nutritionistDragon software. Any errors are unintentional.  Bari EdwardLaura Cornell Gaber, MD [facility]  01/23/2018

## 2018-05-16 ENCOUNTER — Other Ambulatory Visit: Payer: Self-pay

## 2018-05-16 ENCOUNTER — Ambulatory Visit
Admission: EM | Admit: 2018-05-16 | Discharge: 2018-05-16 | Disposition: A | Discharge status: Home / Self Care | Payer: BC Managed Care – PPO | Attending: Emergency Medicine | Admitting: Emergency Medicine

## 2018-05-16 ENCOUNTER — Encounter: Payer: Self-pay | Admitting: Emergency Medicine

## 2018-05-16 DIAGNOSIS — R350 Frequency of micturition: Secondary | ICD-10-CM

## 2018-05-16 DIAGNOSIS — N390 Urinary tract infection, site not specified: Secondary | ICD-10-CM

## 2018-05-16 DIAGNOSIS — R300 Dysuria: Secondary | ICD-10-CM | POA: Diagnosis not present

## 2018-05-16 HISTORY — DX: Urticaria, unspecified: L50.9

## 2018-05-16 LAB — URINALYSIS, COMPLETE (UACMP) WITH MICROSCOPIC
Bilirubin Urine: NEGATIVE
GLUCOSE, UA: NEGATIVE mg/dL
Ketones, ur: NEGATIVE mg/dL
Nitrite: NEGATIVE
PH: 5.5 (ref 5.0–8.0)
Protein, ur: NEGATIVE mg/dL
Specific Gravity, Urine: 1.02 (ref 1.005–1.030)

## 2018-05-16 MED ORDER — KETOROLAC TROMETHAMINE 60 MG/2ML IM SOLN: 30.0000 mg | Freq: Once | INTRAMUSCULAR | Status: DC

## 2018-05-16 MED ORDER — SULFAMETHOXAZOLE-TRIMETHOPRIM 800-160 MG PO TABS: 1.0000 | ORAL_TABLET | Freq: Two times a day (BID) | ORAL | 0 refills | Status: AC

## 2018-05-16 NOTE — ED Provider Notes (Signed)
[facility]    CSN: [id number] Arrival date & time: 05/16/18  0920     History   Chief Complaint Chief Complaint  Patient presents with  . Dysuria  . Urinary Frequency    HPI Kelly Schmidt is a 47 y.o. female. African American female presents with 2-3 day history of burning with urination, increased frequency and urgency. Also admits to left lower back pain that is mild. Denies fever, fatigue, chills, sweats. N/v, abdominal pain, vaginal discharge and concern for STIs. Admits to history of UTIs and states this feels like a UTI.   HPI  Past Medical History:  Diagnosis Date  . DVT (deep venous thrombosis) (HCC)   . Urticaria     Patient Active Problem List   Diagnosis Date Noted  . 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 Low back pain 11/28/2017  . Abnormal uterine bleeding (AUB) 11/25/2017  . 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 Renal insufficiency 12/29/2016  . Lumbar disc disease 03/20/2015  . 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 H/O deep venous thrombosis 12/30/2013  . 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 BUNIONECTOMY    . CRYOABLATION     heavy periods  . TONSILLECTOMY      OB History   None      Home Medications    Prior to Admission medications   Medication Sig Start Date End Date Taking? Authorizing Provider  omalizumab Geoffry Paradise) 150 MG injection Inject into the skin every 28 (twenty-eight) days.   Yes [provider]  albuterol (PROVENTIL HFA;VENTOLIN HFA) 108 (90 Base) MCG/ACT inhaler Inhale 1-2 puffs into the lungs every 6 (six) hours as needed for wheezing or shortness of breath. Use with spacer 07/27/17   Lutricia Feil, PA-C  cetirizine (ZYRTEC) 10 MG tablet Take 10 mg by mouth daily.    [provider]  cyclobenzaprine (FLEXERIL) 10 MG tablet Take 1 tablet (10 mg total) by mouth 3 (three) times daily as needed for muscle spasms. 11/28/17    Reubin Milan, MD  dapsone 100 MG tablet Take 1 tablet by mouth daily. 08/04/17   [provider]  diphenhydrAMINE (BENADRYL) 50 MG capsule Take 50 mg by mouth every 6 (six) hours as needed.    [provider]  fluticasone (FLONASE) 50 MCG/ACT nasal spray Place 2 sprays into both nostrils daily. 07/27/17   Lutricia Feil, PA-C  levocetirizine (XYZAL) 5 MG tablet Take 5 mg by mouth every evening.    [provider]  ranitidine (ZANTAC) 150 MG capsule Take 150 mg by mouth 2 (two) times daily.    [provider]  sulfamethoxazole-trimethoprim (BACTRIM DS,SEPTRA DS) 800-160 MG tablet Take 1 tablet by mouth 2 (two) times daily for 7 days. 05/16/18 05/23/18  Shirlee Latch, PA-C    Family History Family History  Problem Relation Age of Onset  . Hypertension Mother   . Hypertension Father     Social History Social History   Tobacco Use  . Smoking status: Never Smoker  . Smokeless tobacco: Never Used  Substance Use Topics  . Alcohol use: Yes    Alcohol/week: 0.0 standard drinks    Comment: occasional  . Drug use: No     Allergies   Aspirin   Review of Systems Review of Systems  Constitutional: Negative for appetite change, chills, fatigue and fever.  Gastrointestinal: Negative for abdominal pain, nausea and vomiting.  Genitourinary: Positive for difficulty urinating,  dysuria and urgency. Negative for hematuria, pelvic pain, vaginal discharge and vaginal pain.  Musculoskeletal: Negative for arthralgias and myalgias.  Skin: Negative for rash.  Neurological: Negative for dizziness and weakness.  Hematological: Negative for adenopathy.     Physical Exam Triage Vital Signs ED Triage Vitals  Enc Vitals Group     BP 05/16/18 0953 122/69     Pulse Rate 05/16/18 0953 85     Resp 05/16/18 0953 16     Temp 05/16/18 0953 97.7 F (36.5 C)     Temp Source 05/16/18 0953 Oral     SpO2 05/16/18 0953 100 %     Weight 05/16/18 0952 180 lb (81.6  kg)     Height 05/16/18 0952 5\' 5"  (1.651 m)     Head Circumference --      Peak Flow --      Pain Score 05/16/18 0952 3     Pain Loc --      Pain Edu? --      Excl. in GC? --    No data found.  Updated Vital Signs BP 122/69 (BP Location: Left Arm)   Pulse 85   Temp 97.7 F (36.5 C) (Oral)   Resp 16   Ht 5\' 5"  (1.651 m)   Wt 180 lb (81.6 kg)   LMP 02/13/2018 (Approximate)   SpO2 100%   BMI 29.95 kg/m      Physical Exam  Constitutional: She is oriented to person, place, and time. She appears well-developed and well-nourished. No distress.  HENT:  Head: Normocephalic and atraumatic.  Eyes: Pupils are equal, round, and reactive to light. Conjunctivae are normal. No scleral icterus.  Cardiovascular: Normal rate, regular rhythm and normal heart sounds.  No murmur heard. Pulmonary/Chest: Effort normal and breath sounds normal. No respiratory distress. She has no wheezes.  Abdominal: Soft. She exhibits no distension. There is tenderness (mild left CVA tenderness). There is no rebound and no guarding.  Neurological: She is alert and oriented to person, place, and time.  Skin: Skin is warm and dry. No rash noted. She is not diaphoretic.  Psychiatric: She has a normal mood and affect. Her behavior is normal.  Nursing note and vitals reviewed.    UC Treatments / Results  Labs (all labs ordered are listed, but only abnormal results are displayed) Labs Reviewed  URINALYSIS, COMPLETE (UACMP) WITH MICROSCOPIC - Abnormal; Notable for the following components:      Result Value   Hgb urine dipstick MODERATE (*)    Leukocytes, UA MODERATE (*)    Bacteria, UA FEW (*)    All other components within normal limits  URINE CULTURE    EKG None  Radiology No results found.  Procedures Procedures (including critical care time)  Medications Ordered in UC Medications - No data to display  Initial Impression / Assessment and Plan / UC Course  I have reviewed the triage vital  signs and the nursing notes.  Pertinent labs & imaging results that were available during my care of the patient were reviewed by me and considered in my medical decision making (see chart for details).   UA today is positive for RBCs and WBCs. Will send for culture. Advised patient this is consistent with  UTI. Urine will be sent for culture to identify pathogen. Will cover for ascending UTI given CVA tenderness that is mild. Vitals stable. Advised to monitor condition and f/u with our office if worsening or no improvement over then next 2 days.  Final Clinical Impressions(s) / UC Diagnoses   Final diagnoses:  Lower urinary tract infectious disease  Dysuria  Urinary frequency     Discharge Instructions     UTI: Based on your symptoms/presentation today as well as the UA results, you will be treated for a UTI with the prescribed antibiotics. Take as directed and complete the full course. Increase rest and fluid intake. The urine will be sent out for culture. If therapy needs to be monitored after the culture results return, you will receive a phone call from the clinic. You make take pyridium for burning symptoms for the first 2 days. After 2 days the antibiotic should resolve the remaining symptoms. If at any point you develop back pain, N/V, abdominal or pelvic pain, fever. or any worsening of symptoms, f/u with [place] or ER immediately as UTIs can sometimes move further up and affect the kidneys.     ED Prescriptions    Medication Sig Dispense Auth. Provider   sulfamethoxazole-trimethoprim (BACTRIM DS,SEPTRA DS) 800-160 MG tablet Take 1 tablet by mouth 2 (two) times daily for 7 days. 14 tablet Shirlee Latch, PA-C     Controlled Substance Prescriptions [place] Controlled Substance Registry consulted? Not Applicable   Gareth Morgan 05/16/18 1701

## 2018-05-16 NOTE — ED Triage Notes (Signed)
Patient in today c/o dysuria, urinary frequency and left flank pain x 2 days. Patient denies fever.

## 2018-05-16 NOTE — Discharge Instructions (Signed)
UTI: Based on your symptoms/presentation today as well as the UA results, you will be treated for a UTI with the prescribed antibiotics. Take as directed and complete the full course. Increase rest and fluid intake. The urine will be sent out for culture. If therapy needs to be monitored after the culture results return, you will receive a phone call from the clinic. You make take pyridium for burning symptoms for the first 2 days. After 2 days the antibiotic should resolve the remaining symptoms. If at any point you develop back pain, N/V, abdominal or pelvic pain, fever. or any worsening of symptoms, f/u with [place] or ER immediately as UTIs can sometimes move further up and affect the kidneys.

## 2018-05-18 LAB — URINE CULTURE

## 2018-08-03 ENCOUNTER — Other Ambulatory Visit: Payer: Self-pay | Admitting: Obstetrics and Gynecology

## 2018-08-03 DIAGNOSIS — Z1231 Encounter for screening mammogram for malignant neoplasm of breast: Secondary | ICD-10-CM

## 2018-08-22 ENCOUNTER — Ambulatory Visit
Admission: RE | Admit: 2018-08-22 | Discharge: 2018-08-22 | Disposition: A | Discharge status: Home / Self Care | Payer: BC Managed Care – PPO | Source: Ambulatory Visit | Attending: Obstetrics and Gynecology | Admitting: Obstetrics and Gynecology

## 2018-08-22 DIAGNOSIS — Z1231 Encounter for screening mammogram for malignant neoplasm of breast: Secondary | ICD-10-CM | POA: Diagnosis present

## 2018-11-19 ENCOUNTER — Telehealth: Payer: Self-pay | Admitting: Internal Medicine

## 2018-11-19 NOTE — Telephone Encounter (Signed)
Pt returning your call, said she went in to urgent care over the weekend and does not need to set up a appointment.

## 2018-11-19 NOTE — Telephone Encounter (Signed)
UTI. Sounds good thank you.

## 2019-02-11 DIAGNOSIS — M7540 Impingement syndrome of unspecified shoulder: Secondary | ICD-10-CM | POA: Insufficient documentation

## 2019-02-11 DIAGNOSIS — M5412 Radiculopathy, cervical region: Secondary | ICD-10-CM | POA: Insufficient documentation

## 2019-06-03 ENCOUNTER — Ambulatory Visit (INDEPENDENT_AMBULATORY_CARE_PROVIDER_SITE_OTHER): Payer: BC Managed Care – PPO | Admitting: Internal Medicine

## 2019-06-03 ENCOUNTER — Encounter: Payer: Self-pay | Admitting: Internal Medicine

## 2019-06-03 ENCOUNTER — Other Ambulatory Visit: Payer: Self-pay

## 2019-06-03 VITALS — BP 124/60 | HR 89 | Ht 65.0 in | Wt 186.0 lb

## 2019-06-03 DIAGNOSIS — M79672 Pain in left foot: Secondary | ICD-10-CM

## 2019-06-03 NOTE — Progress Notes (Signed)
Date:  06/03/2019   Name:  Kelly Schmidt   DOB:  [birth year]   MRN:  [id number]   Chief Complaint: Foot Pain (Left foot pain- pain in the two or 3 middle toes. Was told it could possibly runners toes. Pain radiates on top of the foot. Can feel a knot on the top of the foot. )  Foot Pain This is a chronic problem. The current episode started more than 1 month ago. The problem occurs daily. The problem has been gradually worsening. Associated symptoms include arthralgias. Pertinent negatives include no chest pain, coughing, fatigue, fever or headaches. The symptoms are aggravated by standing and walking.    @ Lab Results  Component Value Date   CREATININE 1.15 (H) 12/29/2016   BUN 16 12/29/2016   NA 138 12/29/2016   K 4.4 12/29/2016   CL 100 12/29/2016   CO2 23 12/29/2016   @ Lab Results  Component Value Date   CHOL 136 06/02/2013   HDL 59 06/02/2013   LDLCALC 57 06/02/2013   TRIG 101 06/02/2013   @ Lab Results  Component Value Date   TSH 5.39 (H) 06/02/2013   @No  results found for: HGBA1C   Review of Systems  Constitutional: Negative for fatigue and fever.  Respiratory: Negative for cough, chest tightness, shortness of breath and wheezing.   Cardiovascular: Negative for chest pain and palpitations.  Musculoskeletal: Positive for arthralgias.  Neurological: Negative for dizziness and headaches.    Patient Active Problem List   Diagnosis Date Noted  . Cervical radiculopathy 02/11/2019  . Impingement syndrome of shoulder region 02/11/2019  . 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 Low back pain 11/28/2017  . Abnormal uterine bleeding (AUB) 11/25/2017  . 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 Renal insufficiency 12/29/2016  . Lumbar disc disease 03/20/2015  . 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 H/O deep venous thrombosis 12/30/2013  . 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 Aspirin Hives    Past Surgical  History:  Procedure Laterality Date  . BUNIONECTOMY    . CRYOABLATION     heavy periods  . TONSILLECTOMY      Social History   Tobacco Use  . Smoking status: Never Smoker  . Smokeless tobacco: Never Used  Substance Use Topics  . Alcohol use: Yes    Alcohol/week: 0.0 standard drinks    Comment: occasional  . Drug use: No     Medication list has been reviewed and updated.  Current Meds  Medication Sig  . albuterol (PROVENTIL HFA;VENTOLIN HFA) 108 (90 Base) MCG/ACT inhaler Inhale 1-2 puffs into the lungs every 6 (six) hours as needed for wheezing or shortness of breath. Use with spacer  . cetirizine (ZYRTEC) 10 MG tablet Take 10 mg by mouth daily.  . cyclobenzaprine (FLEXERIL) 10 MG tablet Take 1 tablet (10 mg total) by mouth 3 (three) times daily as needed for muscle spasms.  . diphenhydrAMINE (BENADRYL) 50 MG capsule Take 50 mg by mouth every 6 (six) hours as needed.  . fluticasone (FLONASE) 50 MCG/ACT nasal spray Place 2 sprays into both nostrils daily.  Marland Kitchen levocetirizine (XYZAL) 5 MG tablet Take 5 mg by mouth every evening.  Marland Kitchen levonorgestrel (MIRENA) 20 MCG/24HR IUD 1 each by Intrauterine route once.  Marland Kitchen omalizumab (XOLAIR) 150 MG injection Inject into the skin every 28 (twenty-eight) days.    PHQ 2/9 Scores 06/03/2019 11/28/2017  PHQ - 2 Score 0 0    BP Readings  from Last 3 Encounters:  06/03/19 124/60  05/16/18 122/69  01/23/18 126/80    Physical Exam Vitals signs and nursing note reviewed.  Constitutional:      General: She is not in acute distress.    Appearance: She is well-developed.  HENT:     Head: Normocephalic and atraumatic.  Neck:     Musculoskeletal: Normal range of motion.  Cardiovascular:     Rate and Rhythm: Normal rate and regular rhythm.  Pulmonary:     Effort: Pulmonary effort is normal. No respiratory distress.  Musculoskeletal: Normal range of motion.        General: Swelling (distal left foot) present.     Right lower leg: No edema.      Left lower leg: No edema.     Left foot: Prominent metatarsal heads present. No deformity or bunion.       Feet:  Feet:     Left foot:     Skin integrity: Skin integrity normal.     Toenail Condition: Left toenails are normal.  Skin:    General: Skin is warm and dry.     Capillary Refill: Capillary refill takes less than 2 seconds.     Findings: No rash.  Neurological:     General: No focal deficit present.     Mental Status: She is alert and oriented to person, place, and time.  Psychiatric:        Behavior: Behavior normal.        Thought Content: Thought content normal.     Wt Readings from Last 3 Encounters:  06/03/19 186 lb (84.4 kg)  05/16/18 180 lb (81.6 kg)  01/23/18 188 lb (85.3 kg)    BP 124/60   Pulse 89   Ht 5\' 5"  (1.651 m)   Wt 186 lb (84.4 kg)   SpO2 98%   BMI 30.95 kg/m   Assessment and Plan: 1. Left foot pain Uncertain cause - will refer to Podiatry - Ambulatory referral to Podiatry   Partially dictated using Dragon software. Any errors are unintentional.  , MD [facility]  06/03/2019

## 2019-06-18 ENCOUNTER — Ambulatory Visit (INDEPENDENT_AMBULATORY_CARE_PROVIDER_SITE_OTHER): Payer: BC Managed Care – PPO

## 2019-06-18 ENCOUNTER — Other Ambulatory Visit: Payer: Self-pay

## 2019-06-18 DIAGNOSIS — Z23 Encounter for immunization: Secondary | ICD-10-CM | POA: Diagnosis not present

## 2019-08-07 ENCOUNTER — Other Ambulatory Visit: Payer: Self-pay | Admitting: Obstetrics and Gynecology

## 2019-08-07 DIAGNOSIS — Z1231 Encounter for screening mammogram for malignant neoplasm of breast: Secondary | ICD-10-CM

## 2019-08-07 DIAGNOSIS — Z655 Exposure to disaster, war and other hostilities: Secondary | ICD-10-CM | POA: Insufficient documentation

## 2019-08-07 DIAGNOSIS — E669 Obesity, unspecified: Secondary | ICD-10-CM | POA: Insufficient documentation

## 2019-08-07 DIAGNOSIS — N951 Menopausal and female climacteric states: Secondary | ICD-10-CM | POA: Insufficient documentation

## 2019-08-07 DIAGNOSIS — E042 Nontoxic multinodular goiter: Secondary | ICD-10-CM | POA: Insufficient documentation

## 2019-09-04 ENCOUNTER — Ambulatory Visit
Admission: RE | Admit: 2019-09-04 | Discharge: 2019-09-04 | Disposition: A | Discharge status: Home / Self Care | Payer: BC Managed Care – PPO | Source: Ambulatory Visit | Attending: Obstetrics and Gynecology | Admitting: Obstetrics and Gynecology

## 2019-09-04 DIAGNOSIS — Z1231 Encounter for screening mammogram for malignant neoplasm of breast: Secondary | ICD-10-CM | POA: Insufficient documentation

## 2019-11-13 ENCOUNTER — Ambulatory Visit (INDEPENDENT_AMBULATORY_CARE_PROVIDER_SITE_OTHER): Payer: BC Managed Care – PPO | Admitting: Surgery

## 2019-11-13 ENCOUNTER — Encounter: Payer: Self-pay | Admitting: Internal Medicine

## 2019-11-13 ENCOUNTER — Other Ambulatory Visit: Payer: Self-pay

## 2019-11-13 ENCOUNTER — Encounter: Payer: Self-pay | Admitting: Surgery

## 2019-11-13 ENCOUNTER — Ambulatory Visit (INDEPENDENT_AMBULATORY_CARE_PROVIDER_SITE_OTHER): Payer: BC Managed Care – PPO | Admitting: Internal Medicine

## 2019-11-13 VITALS — BP 108/70 | HR 101 | Temp 97.2°F | Resp 12 | Ht 65.0 in | Wt 192.2 lb

## 2019-11-13 VITALS — BP 118/64 | HR 110 | Temp 97.1°F | Ht 65.0 in | Wt 191.0 lb

## 2019-11-13 DIAGNOSIS — K645 Perianal venous thrombosis: Secondary | ICD-10-CM

## 2019-11-13 NOTE — Progress Notes (Signed)
Patient ID: Kelly Schmidt, female   DOB: Oct 21, 1970, 49 y.o.   MRN: [id number]  HPI Kelly Schmidt is a 49 y.o. female seen in consultation at the request of Dr. Judithann Graves for thrombosed hemorrhoid.  She reports that she has been having anorectal pain for the last 2 to 3days and over the last 48 hours has been severe.  It is a sharp pain that is intermittent and get worse when she sits down.  She does have some associated constipation.  No fevers no chills no previous colonoscopies.  No previous hemorrhoidal disease.  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  She does take Synthroid for her thyroid. She is a straight trooper.  She is able to perform more than 4 METS of activity without any shortness of breath or chest pain. No family history of colorectal cancer  HPI  Past Medical History:  Diagnosis Date  . DVT (deep venous thrombosis) (HCC)   . Thyroid nodule   . Urticaria     Past Surgical History:  Procedure Laterality Date  . BUNIONECTOMY    . CRYOABLATION     heavy periods  . TONSILLECTOMY      Family History  Problem Relation Age of Onset  . Hypertension Mother   . Hypertension Father   . Breast cancer Neg Hx     Social History Social History   Tobacco Use  . Smoking status: Never Smoker  . Smokeless tobacco: Never Used  Substance Use Topics  . Alcohol use: Yes    Alcohol/week: 0.0 standard drinks    Comment: occasional  . Drug use: No    Allergies  Allergen Reactions  . Aspirin Hives  . Lexapro [Escitalopram] Itching    Current Outpatient Medications  Medication Sig Dispense Refill  . albuterol (PROVENTIL HFA;VENTOLIN HFA) 108 (90 Base) MCG/ACT inhaler Inhale 1-2 puffs into the lungs every 6 (six) hours as needed for wheezing or shortness of breath. Use with spacer 1 Inhaler 0  . cetirizine (ZYRTEC) 10 MG tablet Take 10 mg by mouth daily.    . cyclobenzaprine (FLEXERIL) 10 MG tablet Take 1  tablet (10 mg total) by mouth 3 (three) times daily as needed for muscle spasms. 90 tablet 0  . diphenhydrAMINE (BENADRYL) 50 MG capsule Take 50 mg by mouth every 6 (six) hours as needed.    . fluticasone (FLONASE) 50 MCG/ACT nasal spray Place 2 sprays into both nostrils daily. 16 g 0  . levocetirizine (XYZAL) 5 MG tablet Take 5 mg by mouth every evening.    Marland Kitchen levonorgestrel (MIRENA) 20 MCG/24HR IUD 1 each by Intrauterine route once.    Marland Kitchen levothyroxine (SYNTHROID) 50 MCG tablet Take by mouth.    Marland Kitchen omalizumab (XOLAIR) 150 MG injection Inject into the skin every 28 (twenty-eight) days.    . phentermine (ADIPEX-P) 37.5 MG tablet Take by mouth.     No current facility-administered medications for this visit.     Review of Systems Full ROS  was asked and was negative except for the information on the HPI  Physical Exam Blood pressure 108/70, pulse (!) 101, temperature (!) 97.2 F (36.2 C), resp. rate 12, height 5\' 5"  (1.651 m), weight 192 lb 3.2 oz (87.2 kg), SpO2 98 %. CONSTITUTIONAL: NAD EYES: Pupils are equal, round, and reactive to light, Sclera are non-icteric. EARS, NOSE, MOUTH AND THROAT: The oropharynx is clear. The oral mucosa is pink and moist. Hearing  is intact to voice. LYMPH NODES:  Lymph nodes in the neck are normal. RESPIRATORY:  Lungs are clear. There is normal respiratory effort, with equal breath sounds bilaterally, and without pathologic use of accessory muscles. CARDIOVASCULAR: Heart is regular without murmurs, gallops, or rubs. GI: The abdomen is  soft, nontender, and nondistended. There are no palpable masses. There is no hepatosplenomegaly. There are normal bowel sounds in all quadrants. Rectal: Right lateral Internal/external thrombosed hemorrhoid exquisitely tender to palpation   MUSCULOSKELETAL: Normal muscle strength and tone. No cyanosis or edema.   SKIN: Turgor is good and there are no pathologic skin lesions or ulcers. NEUROLOGIC: Motor and sensation is grossly  normal. Cranial nerves are grossly intact. PSYCH:  Oriented to person, place and time. Affect is normal.  Data Reviewed I have personally reviewed the patient's imaging, laboratory findings and medical records.    Assessment/Plan 49 year old female with right lateral thrombosed hemorrhoid.  She is in need for excision and I&D of thrombosed hemorrhoid.  Procedure discussed with patient detail.  Risk benefit and possible applications and consent was obtained. Advised about daily fiber and twice daily sitz bath's.  I will see her back in 2 to 3 weeks.  She will need a colonoscopy in 2 to 3 months as well.  Extensive counseling provided to the patient.  A copy of this report was sent to the referring provider  Procedure Note 1.  Excision of thrombosed external hemorrhoid and drainage of thrombosed hemorrhoid  Anesthesia: lidocaine 1% w epi  EBL : minimal  Findings: thrombosed right lateral thrombosed external hemorrhoid  Patient was placed in a lateral decubitus position.  She was prepped and draped in the usual sterile fashion and injecting lidocaine around the hemorrhoid were able to obtain a good local control.  11 blade knife was used to incise the external hemorrhoid and a clot was removed.  Also in addition were able to remove the external hemorrhoid using 11 blade knife.  Pressure was held to control hemostasis and a sterile dressing was applied.  There were no immediate complications.   Time spent with the patient was 40 minutes, with more than 50% of the time spent in face-to-face education, counseling and care coordination.     Caroleen Hamman, MD FACS General Surgeon 11/13/2019, 3:15 PM

## 2019-11-13 NOTE — Progress Notes (Signed)
Date:  11/13/2019   Name:  Kelly Schmidt   DOB:  29-Mar-1971   MRN:  [id number]   Chief Complaint: Hemorrhoids (Painful hemmorrhoids- started sunday. Yesterday they burst. Tried prep H cream and wipes. )  Rectal Bleeding  The current episode started 2 days ago. The onset was sudden. The pain is moderate. The stool is described as hard. There was no prior successful therapy. Pertinent negatives include no fever, no chest pain and no headaches.    Lab Results  Component Value Date   CREATININE 1.15 (H) 12/29/2016   BUN 16 12/29/2016   NA 138 12/29/2016   K 4.4 12/29/2016   CL 100 12/29/2016   CO2 23 12/29/2016   Lab Results  Component Value Date   CHOL 136 06/02/2013   HDL 59 06/02/2013   LDLCALC 57 06/02/2013   TRIG 101 06/02/2013   Lab Results  Component Value Date   TSH 5.39 (H) 06/02/2013   No results found for: HGBA1C Lab Results  Component Value Date   WBC 7.3 06/02/2013   HGB 12.5 06/02/2013   HCT 37.3 06/02/2013   MCV 87 06/02/2013   PLT 210 06/02/2013   Lab Results  Component Value Date   ALT 15 06/01/2013   AST 10 (L) 06/01/2013   ALKPHOS 70 06/01/2013   BILITOT 0.3 06/01/2013     Review of Systems  Constitutional: Negative for chills, fatigue and fever.  Respiratory: Negative for shortness of breath.   Cardiovascular: Negative for chest pain and palpitations.  Gastrointestinal: Positive for anal bleeding (and hemorriod with pain) and hematochezia.  Neurological: Negative for dizziness and headaches.    Patient Active Problem List   Diagnosis Date Noted  . 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 Symptomatic menopausal or female climacteric states 08/07/2019  . Obesity 08/07/2019  . Exposure to combat 08/07/2019  . Cervical radiculopathy 02/11/2019  . Impingement syndrome of shoulder region 02/11/2019  . 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 Low back pain 11/28/2017  . Abnormal uterine bleeding (AUB) 11/25/2017  .  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 Renal insufficiency 12/29/2016  . Lumbar disc disease 03/20/2015  . 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 H/O deep venous thrombosis 12/30/2013  . 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 Aspirin Hives  . Lexapro [Escitalopram] Itching    Past Surgical History:  Procedure Laterality Date  . BUNIONECTOMY    . CRYOABLATION     heavy periods  . TONSILLECTOMY      Social History   Tobacco Use  . Smoking status: Never Smoker  . Smokeless tobacco: Never Used  Substance Use Topics  . Alcohol use: Yes    Alcohol/week: 0.0 standard drinks    Comment: occasional  . Drug use: No     Medication list has been reviewed and updated.  Current Meds  Medication Sig  . albuterol (PROVENTIL HFA;VENTOLIN HFA) 108 (90 Base) MCG/ACT inhaler Inhale 1-2 puffs into the lungs every 6 (six) hours as needed for wheezing or shortness of breath. Use with spacer  . cetirizine (ZYRTEC) 10 MG tablet Take 10 mg by mouth daily.  . cyclobenzaprine (FLEXERIL) 10 MG tablet Take 1 tablet (10 mg total) by mouth 3 (three) times daily as needed for muscle spasms.  . diphenhydrAMINE (BENADRYL) 50 MG capsule Take 50 mg by mouth every 6 (six) hours as needed.  . fluticasone (FLONASE) 50 MCG/ACT nasal spray Place 2 sprays into both nostrils daily.  Marland Kitchen  levocetirizine (XYZAL) 5 MG tablet Take 5 mg by mouth every evening.  Marland Kitchen levonorgestrel (MIRENA) 20 MCG/24HR IUD 1 each by Intrauterine route once.  Marland Kitchen levothyroxine (SYNTHROID) 50 MCG tablet Take by mouth.  Marland Kitchen omalizumab (XOLAIR) 150 MG injection Inject into the skin every 28 (twenty-eight) days.  . phentermine (ADIPEX-P) 37.5 MG tablet Take by mouth.    PHQ 2/9 Scores 11/13/2019 06/03/2019 11/28/2017  PHQ - 2 Score 0 0 0  PHQ- 9 Score 0 - -    BP Readings from Last 3 Encounters:  11/13/19 118/64  06/03/19 124/60  05/16/18 122/69    Physical Exam Vitals and nursing note reviewed.    Constitutional:      General: She is not in acute distress.    Appearance: She is well-developed.  HENT:     Head: Normocephalic and atraumatic.  Pulmonary:     Effort: Pulmonary effort is normal. No respiratory distress.  Genitourinary:   Musculoskeletal:        General: Normal range of motion.  Skin:    General: Skin is warm and dry.     Findings: No rash.  Neurological:     Mental Status: She is alert and oriented to person, place, and time.  Psychiatric:        Behavior: Behavior normal.        Thought Content: Thought content normal.     Wt Readings from Last 3 Encounters:  11/13/19 191 lb (86.6 kg)  06/03/19 186 lb (84.4 kg)  05/16/18 180 lb (81.6 kg)    BP 118/64   Pulse (!) 110   Temp (!) 97.1 F (36.2 C) (Temporal)   Ht 5\' 5"  (1.651 m)   Wt 191 lb (86.6 kg)   SpO2 97%   BMI 31.78 kg/m   Assessment and Plan: 1. External thrombosed hemorrhoids Continue wet wipes for hygiene and will refer for definitive treatment - Ambulatory referral to General Surgery   Partially dictated using Dragon software. Any errors are unintentional.  Halina Maidens, MD [facility]  11/13/2019

## 2019-11-13 NOTE — Patient Instructions (Signed)
Start taking FIBER. Start taking sitz baths to help relieve pain and pressure. You may apply gauze in between the buttocks for oozing if need to.   We will place a referral to [facility] for you to have a Colonoscopy in 2 months. There office will contact you to set up an appointment.   Please see you appointment below.  Hemorrhoids Hemorrhoids are swollen veins in and around the rectum or anus. There are two types of hemorrhoids:  Internal hemorrhoids. These occur in the veins that are just inside the rectum. They may poke through to the outside and become irritated and painful.  External hemorrhoids. These occur in the veins that are outside the anus and can be felt as a painful swelling or hard lump near the anus. Most hemorrhoids do not cause serious problems, and they can be managed with home treatments such as diet and lifestyle changes. If home treatments do not help the symptoms, procedures can be done to shrink or remove the hemorrhoids. What are the causes? This condition is caused by increased pressure in the anal area. This pressure may result from various things, including:  Constipation.  Straining to have a bowel movement.  Diarrhea.  Pregnancy.  Obesity.  Sitting for long periods of time.  Heavy lifting or other activity that causes you to strain.  Anal sex.  Riding a bike for a long period of time. What are the signs or symptoms? Symptoms of this condition include:  Pain.  Anal itching or irritation.  Rectal bleeding.  Leakage of stool (feces).  Anal swelling.  One or more lumps around the anus. How is this diagnosed? This condition can often be diagnosed through a visual exam. Other exams or tests may also be done, such as:  An exam that involves feeling the rectal area with a gloved hand (digital rectal exam).  An exam of the anal canal that is done using a small tube (anoscope).  A blood test, if you have lost a significant  amount of blood.  A test to look inside the colon using a flexible tube with a camera on the end (sigmoidoscopy or colonoscopy). How is this treated? This condition can usually be treated at home. However, various procedures may be done if dietary changes, lifestyle changes, and other home treatments do not help your symptoms. These procedures can help make the hemorrhoids smaller or remove them completely. Some of these procedures involve surgery, and others do not. Common procedures include:  Rubber band ligation. Rubber bands are placed at the base of the hemorrhoids to cut off their blood supply.  Sclerotherapy. Medicine is injected into the hemorrhoids to shrink them.  Infrared coagulation. A type of light energy is used to get rid of the hemorrhoids.  Hemorrhoidectomy surgery. The hemorrhoids are surgically removed, and the veins that supply them are tied off.  Stapled hemorrhoidopexy surgery. The surgeon staples the base of the hemorrhoid to the rectal wall. Follow these instructions at home: Eating and drinking   Eat foods that have a lot of fiber in them, such as whole grains, beans, nuts, fruits, and vegetables.  Ask your health care provider about taking products that have added fiber (fiber supplements).  Reduce the amount of fat in your diet. You can do this by eating low-fat dairy products, eating less red meat, and avoiding processed foods.  Drink enough fluid to keep your urine pale yellow. Managing pain and swelling   Take warm sitz baths for 20 minutes, 3-4  times a day to ease pain and discomfort. You may do this in a bathtub or using a portable sitz bath that fits over the toilet.  If directed, apply ice to the affected area. Using ice packs between sitz baths may be helpful. ? Put ice in a plastic bag. ? Place a towel between your skin and the bag. ? Leave the ice on for 20 minutes, 2-3 times a day. General instructions  Take over-the-counter and prescription  medicines only as told by your health care provider.  Use medicated creams or suppositories as told.  Get regular exercise. Ask your health care provider how much and what kind of exercise is best for you. In general, you should do moderate exercise for at least 30 minutes on most days of the week (150 minutes each week). This can include activities such as walking, biking, or yoga.  Go to the bathroom when you have the urge to have a bowel movement. Do not wait.  Avoid straining to have bowel movements.  Keep the anal area dry and clean. Use wet toilet paper or moist towelettes after a bowel movement.  Do not sit on the toilet for long periods of time. This increases blood pooling and pain.  Keep all follow-up visits as told by your health care provider. This is important. Contact a health care provider if you have:  Increasing pain and swelling that are not controlled by treatment or medicine.  Difficulty having a bowel movement, or you are unable to have a bowel movement.  Pain or inflammation outside the area of the hemorrhoids. Get help right away if you have:  Uncontrolled bleeding from your rectum. Summary  Hemorrhoids are swollen veins in and around the rectum or anus.  Most hemorrhoids can be managed with home treatments such as diet and lifestyle changes.  Taking warm sitz baths can help ease pain and discomfort.  In severe cases, procedures or surgery can be done to shrink or remove the hemorrhoids. This information is not intended to replace advice given to you by your health care provider. Make sure you discuss any questions you have with your health care provider. Document Revised: 12/07/2018 Document Reviewed: 11/30/2017 Elsevier Patient Education  2020 [employer].

## 2019-11-25 ENCOUNTER — Encounter: Payer: Self-pay | Admitting: Surgery

## 2019-11-25 ENCOUNTER — Other Ambulatory Visit: Payer: Self-pay

## 2019-11-25 ENCOUNTER — Ambulatory Visit (INDEPENDENT_AMBULATORY_CARE_PROVIDER_SITE_OTHER): Payer: BC Managed Care – PPO | Admitting: Surgery

## 2019-11-25 VITALS — BP 110/71 | HR 97 | Temp 97.2°F | Resp 14 | Ht 65.0 in | Wt 190.0 lb

## 2019-11-25 DIAGNOSIS — Z09 Encounter for follow-up examination after completed treatment for conditions other than malignant neoplasm: Secondary | ICD-10-CM

## 2019-11-25 NOTE — Patient Instructions (Addendum)
You will need to continue your Bowel Regimen until you are completely pain free.  You may do Sitz baths 2-3 times daily as needed for discomfort.  If you develop any increased pain, bleeding, redness, drainage of pus, or any fever or chills; please call our office immediately so that we can work you in.  Please call our office with any questions or concerns. Follow-up with our office as needed.

## 2019-11-25 NOTE — Progress Notes (Signed)
Outpatient Surgical Follow Up  11/25/2019  Kelly Schmidt is an 49 y.o. female.   Chief Complaint  Patient presents with  . Follow-up    hemorrhoid    HPI: Kelly Schmidt is following up for her hemorrhoid.  After the I&D she felt much better.  She denies any hematochezia.  No pain.  No fevers no chills.  No Complaints at this time  Past Medical History:  Diagnosis Date  . DVT (deep venous thrombosis) (HCC)   . Thyroid nodule   . Urticaria     Past Surgical History:  Procedure Laterality Date  . BUNIONECTOMY    . CRYOABLATION     heavy periods  . TONSILLECTOMY      Family History  Problem Relation Age of Onset  . Hypertension Mother   . Hypertension Father   . Breast cancer Neg Hx     Social History:  reports that she has never smoked. She has never used smokeless tobacco. She reports current alcohol use. She reports that she does not use drugs.  Allergies:  Allergies  Allergen Reactions  . Aspirin Hives  . Lexapro [Escitalopram] Itching    Medications reviewed.    ROS Full ROS performed and is otherwise negative other than what is stated in HPI   BP 110/71   Pulse 97   Temp (!) 97.2 F (36.2 C)   Resp 14   Ht 5\' 5"  (1.651 m)   Wt 190 lb (86.2 kg)   SpO2 97%   BMI 31.62 kg/m   Physical Exam Vitals and nursing note reviewed. Exam conducted with a chaperone present.  Constitutional:      General: She is not in acute distress.    Appearance: Normal appearance. She is normal weight. She is not ill-appearing.  Cardiovascular:     Rate and Rhythm: Normal rate and regular rhythm.  Pulmonary:     Effort: Pulmonary effort is normal. No respiratory distress.     Breath sounds: Normal breath sounds.  Abdominal:     General: Abdomen is flat. There is no distension.     Palpations: Abdomen is soft. There is no mass.     Tenderness: There is no abdominal tenderness.     Hernia: No hernia is present.  Musculoskeletal:     Cervical back: Normal range of  motion and neck supple. No rigidity or tenderness.  Skin:    General: Skin is warm and dry.     Capillary Refill: Capillary refill takes less than 2 seconds.  Neurological:     General: No focal deficit present.     Mental Status: She is alert and oriented to person, place, and time.  Psychiatric:        Mood and Affect: Mood normal.        Behavior: Behavior normal.        Thought Content: Thought content normal.        Judgment: Judgment normal.       Assessment/Plan:  49 year old female status post I&D of external hemorrhoid doing great without any symptoms.  She does have an upcoming colonoscopy.  Continue current therapy.  No need for any further interventions at this time.  She is very appreciative   Greater than 50% of the 15 minutes  visit was spent in counseling/coordination of care   53, MD [facility] General Surgeon

## 2019-12-11 ENCOUNTER — Ambulatory Visit: Payer: Self-pay | Admitting: Surgery

## 2020-01-08 ENCOUNTER — Other Ambulatory Visit: Payer: Self-pay

## 2020-01-08 ENCOUNTER — Ambulatory Visit: Payer: BC Managed Care – PPO | Admitting: Gastroenterology

## 2020-01-08 DIAGNOSIS — Z1211 Encounter for screening for malignant neoplasm of colon: Secondary | ICD-10-CM

## 2020-03-10 ENCOUNTER — Other Ambulatory Visit: Payer: Self-pay

## 2020-03-10 ENCOUNTER — Other Ambulatory Visit
Admission: RE | Admit: 2020-03-10 | Discharge: 2020-03-10 | Disposition: A | Discharge status: Home / Self Care | Payer: BC Managed Care – PPO | Source: Ambulatory Visit | Attending: Gastroenterology | Admitting: Gastroenterology

## 2020-03-10 DIAGNOSIS — Z20822 Contact with and (suspected) exposure to covid-19: Secondary | ICD-10-CM | POA: Diagnosis not present

## 2020-03-10 DIAGNOSIS — Z01812 Encounter for preprocedural laboratory examination: Secondary | ICD-10-CM | POA: Diagnosis present

## 2020-03-10 LAB — SARS CORONAVIRUS 2 (TAT 6-24 HRS): SARS Coronavirus 2: NEGATIVE

## 2020-03-11 ENCOUNTER — Other Ambulatory Visit: Payer: BC Managed Care – PPO

## 2020-03-13 ENCOUNTER — Encounter
Admission: RE | Disposition: A | Discharge status: Home / Self Care | Payer: Self-pay | Source: Home / Self Care | Attending: Gastroenterology

## 2020-03-13 ENCOUNTER — Encounter: Payer: Self-pay | Admitting: Gastroenterology

## 2020-03-13 ENCOUNTER — Ambulatory Visit
Admission: RE | Admit: 2020-03-13 | Discharge: 2020-03-13 | Disposition: A | Discharge status: Home / Self Care | Payer: BC Managed Care – PPO | Attending: Gastroenterology | Admitting: Gastroenterology

## 2020-03-13 ENCOUNTER — Ambulatory Visit: Payer: BC Managed Care – PPO | Admitting: Anesthesiology

## 2020-03-13 ENCOUNTER — Other Ambulatory Visit: Payer: Self-pay

## 2020-03-13 DIAGNOSIS — Z86718 Personal history of other venous thrombosis and embolism: Secondary | ICD-10-CM | POA: Diagnosis not present

## 2020-03-13 DIAGNOSIS — L509 Urticaria, unspecified: Secondary | ICD-10-CM | POA: Diagnosis not present

## 2020-03-13 DIAGNOSIS — J45909 Unspecified asthma, uncomplicated: Secondary | ICD-10-CM | POA: Insufficient documentation

## 2020-03-13 DIAGNOSIS — Z1211 Encounter for screening for malignant neoplasm of colon: Secondary | ICD-10-CM

## 2020-03-13 DIAGNOSIS — Z79890 Hormone replacement therapy (postmenopausal): Secondary | ICD-10-CM | POA: Diagnosis not present

## 2020-03-13 DIAGNOSIS — E039 Hypothyroidism, unspecified: Secondary | ICD-10-CM | POA: Diagnosis not present

## 2020-03-13 DIAGNOSIS — Z79899 Other long term (current) drug therapy: Secondary | ICD-10-CM | POA: Insufficient documentation

## 2020-03-13 HISTORY — PX: COLONOSCOPY WITH PROPOFOL: SHX5780

## 2020-03-13 LAB — POCT PREGNANCY, URINE: Preg Test, Ur: NEGATIVE

## 2020-03-13 SURGERY — COLONOSCOPY WITH PROPOFOL
Anesthesia: General

## 2020-03-13 MED ORDER — PROPOFOL 500 MG/50ML IV EMUL
INTRAVENOUS | Status: DC | PRN
  Administered 2020-03-13: 165 ug/kg/min via INTRAVENOUS

## 2020-03-13 MED ORDER — LIDOCAINE HCL (CARDIAC) PF 100 MG/5ML IV SOSY
PREFILLED_SYRINGE | INTRAVENOUS | Status: DC | PRN
  Administered 2020-03-13: 100 mg via INTRAVENOUS

## 2020-03-13 MED ORDER — MIDAZOLAM HCL 2 MG/2ML IJ SOLN
INTRAMUSCULAR | Status: DC | PRN
  Administered 2020-03-13: 2 mg via INTRAVENOUS

## 2020-03-13 MED ORDER — MIDAZOLAM HCL 2 MG/2ML IJ SOLN
INTRAMUSCULAR | Status: AC
  Filled 2020-03-13: qty 2

## 2020-03-13 MED ORDER — PROPOFOL 10 MG/ML IV BOLUS
INTRAVENOUS | Status: DC | PRN
  Administered 2020-03-13: 50 mg via INTRAVENOUS
  Administered 2020-03-13: 20 mg via INTRAVENOUS
  Administered 2020-03-13: 10 mg via INTRAVENOUS

## 2020-03-13 MED ORDER — SODIUM CHLORIDE 0.9 % IV SOLN: INTRAVENOUS | Status: DC

## 2020-03-13 MED ORDER — GLYCOPYRROLATE 0.2 MG/ML IJ SOLN
INTRAMUSCULAR | Status: DC | PRN
  Administered 2020-03-13: .2 mg via INTRAVENOUS

## 2020-03-13 NOTE — Anesthesia Postprocedure Evaluation (Signed)
Anesthesia Post Note  Patient: Kelly Schmidt  Procedure(s) Performed: COLONOSCOPY WITH PROPOFOL (N/A )  Patient location during evaluation: Endoscopy Anesthesia Type: General Level of consciousness: awake and alert Pain management: pain level controlled Vital Signs Assessment: post-procedure vital signs reviewed and stable Respiratory status: spontaneous breathing, nonlabored ventilation, respiratory function stable and patient connected to nasal cannula oxygen Cardiovascular status: blood pressure returned to baseline and stable Postop Assessment: no apparent nausea or vomiting Anesthetic complications: no   No complications documented.   Last Vitals:  Vitals:   03/13/20 0833 03/13/20 0843  BP: 106/77 109/71  Pulse: 93 86  Resp: 14 13  Temp:    SpO2: 97% 100%    Last Pain:  Vitals:   03/13/20 0843  TempSrc:   PainSc: 0-No pain                 Lenard Simmer

## 2020-03-13 NOTE — Anesthesia Procedure Notes (Signed)
Procedure Name: General with mask airway Performed by: Mohammed Kindle, CRNA Pre-anesthesia Checklist: Patient identified, Emergency Drugs available, Suction available and Patient being monitored Patient Re-evaluated:Patient Re-evaluated prior to induction Oxygen Delivery Method: Simple face mask Induction Type: IV induction Placement Confirmation: CO2 detector Dental Injury: Teeth and Oropharynx as per pre-operative assessment

## 2020-03-13 NOTE — Op Note (Signed)
[facility] Patient Name: Kelly Schmidt Procedure Date: 03/13/2020 8:00 AM MRN: [id number] Account #: [id number] Date of Birth: [birth year] Admit Type: Outpatient Age: 49 Room: [facility] ENDO ROOM 4 Gender: Female Note Status: Finalized Procedure:             Colonoscopy Indications:           Screening for colorectal malignant neoplasm Providers:             Wyline Mood MD, MD Referring MD:          Bari Edward, MD (Referring MD) Medicines:             Monitored Anesthesia Care Complications:         No immediate complications. Procedure:             Pre-Anesthesia Assessment:                        - Prior to the procedure, a History and Physical was                         performed, and patient medications, allergies and                         sensitivities were reviewed. The patient's tolerance                         of previous anesthesia was reviewed.                        - The risks and benefits of the procedure and the                         sedation options and risks were discussed with the                         patient. All questions were answered and informed                         consent was obtained.                        - ASA Grade Assessment: II - A patient with mild                         systemic disease.                        After obtaining informed consent, the colonoscope was                         passed under direct vision. Throughout the procedure,                         the patient's blood pressure, pulse, and oxygen                         saturations were monitored continuously. The                         Colonoscope was introduced through the anus and  advanced to the the cecum, identified by the                         appendiceal orifice. The colonoscopy was performed                         with ease. The patient tolerated the procedure well.                         The quality  of the bowel preparation was excellent. Findings:      The perianal and digital rectal examinations were normal.      The entire examined colon appeared normal on direct and retroflexion       views. Impression:            - The entire examined colon is normal on direct and                         retroflexion views.                        - No specimens collected. Recommendation:        - Discharge patient to home (with escort).                        - Resume previous diet.                        - Continue present medications.                        - Repeat colonoscopy in 10 years for screening                         purposes. Procedure Code(s):     --- Professional ---                        [phone], Colonoscopy, flexible; diagnostic, including                         collection of specimen(s) by brushing or washing, when                         performed (separate procedure) Diagnosis Code(s):     --- Professional ---                        Z12.11, Encounter for screening for malignant neoplasm                         of colon CPT copyright 2019 American [employer]. All rights reserved. The codes documented in this report are preliminary and upon coder review may  be revised to meet current compliance requirements. Wyline Mood, MD Wyline Mood MD, MD 03/13/2020 8:20:05 AM This report has been signed electronically. Number of Addenda: 0 Note Initiated On: 03/13/2020 8:00 AM Scope Withdrawal Time: 0 hours 8 minutes 44 seconds  Total Procedure Duration: 0 hours 12 minutes 29 seconds  Estimated Blood Loss:  Estimated blood loss: none.      [facility]

## 2020-03-13 NOTE — H&P (Signed)
Wyline Mood, MD [address], [place], [place], [address] [address], [address], [place], [place], [address] Phone: [phone]  Fax: [phone]  Primary Care Physician:  Reubin Milan, MD   Pre-Procedure History & Physical: HPI:  Kelly Schmidt is a 49 y.o. female is here for an colonoscopy.   Past Medical History:  Diagnosis Date  . DVT (deep venous thrombosis) (HCC)   . Thyroid nodule   . Urticaria     Past Surgical History:  Procedure Laterality Date  . BUNIONECTOMY    . CRYOABLATION     heavy periods  . TONSILLECTOMY      Prior to Admission medications   Medication Sig Start Date End Date Taking? Authorizing Provider  albuterol (PROVENTIL HFA;VENTOLIN HFA) 108 (90 Base) MCG/ACT inhaler Inhale 1-2 puffs into the lungs every 6 (six) hours as needed for wheezing or shortness of breath. Use with spacer 07/27/17  Yes Lutricia Feil, PA-C  cetirizine (ZYRTEC) 10 MG tablet Take 10 mg by mouth daily.   Yes [provider]  cyclobenzaprine (FLEXERIL) 10 MG tablet Take 1 tablet (10 mg total) by mouth 3 (three) times daily as needed for muscle spasms. 11/28/17  Yes Reubin Milan, MD  diphenhydrAMINE (BENADRYL) 50 MG capsule Take 50 mg by mouth every 6 (six) hours as needed.   Yes [provider]  levocetirizine (XYZAL) 5 MG tablet Take 5 mg by mouth every evening.   Yes [provider]  levonorgestrel (MIRENA) 20 MCG/24HR IUD 1 each by Intrauterine route once.   Yes [provider]  levothyroxine (SYNTHROID) 50 MCG tablet Take by mouth. 11/08/19 11/07/20 Yes [provider]  omalizumab Geoffry [facility]) 150 MG injection Inject into the skin every 28 (twenty-eight) days.   Yes [provider]  fluticasone (FLONASE) 50 MCG/ACT nasal spray Place 2 sprays into both nostrils daily. Patient not taking: Reported on 03/13/2020 07/27/17   Lutricia Feil, PA-C    Allergies as of 01/08/2020 - Review Complete  11/25/2019  Allergen Reaction Noted  . Aspirin Hives 03/20/2015  . Lexapro [escitalopram] Itching 06/03/2019    Family History  Problem Relation Age of Onset  . Hypertension Mother   . Hypertension Father   . Breast cancer Neg Hx     Social History   Socioeconomic History  . Marital status: Married    Spouse name: Not on file  . Number of children: Not on file  . Years of education: Not on file  . Highest education level: Not on file  Occupational History  . Not on file  Tobacco Use  . Smoking status: Never Smoker  . Smokeless tobacco: Never Used  Vaping Use  . Vaping Use: Never used  Substance and Sexual Activity  . Alcohol use: Yes    Alcohol/week: 1.0 standard drink    Types: 1 Glasses of wine per week  . Drug use: No  . Sexual activity: Yes  Other Topics Concern  . Not on file  Social History Narrative  . Not on file   Social Determinants of Health   Financial Resource Strain:   . Difficulty of Paying Living Expenses: Not on file  Food Insecurity:   . Worried About Programme researcher, broadcasting/film/video in the Last Year: Not on file  . Ran Out of Food in the Last Year: Not on file  Transportation Needs:   . Lack of Transportation (Medical): Not on file  . Lack of Transportation (Non-Medical): Not  on file  Physical Activity:   . Days of Exercise per Week: Not on file  . Minutes of Exercise per Session: Not on file  Stress:   . Feeling of Stress : Not on file  Social Connections:   . Frequency of Communication with Friends and Family: Not on file  . Frequency of Social Gatherings with Friends and Family: Not on file  . Attends Religious Services: Not on file  . Active Member of Clubs or Organizations: Not on file  . Attends Banker Meetings: Not on file  . Marital Status: Not on file  Intimate Partner Violence:   . Fear of Current or Ex-Partner: Not on file  . Emotionally Abused: Not on file  . Physically Abused: Not on file  . Sexually Abused: Not on file     Review of Systems: See HPI, otherwise negative ROS  Physical Exam: BP 109/71   Pulse 94   Temp (!) 96.7 F (35.9 C) (Temporal)   Resp 18   Ht 5\' 5"  (1.651 m)   Wt 85.7 kg   SpO2 100%   BMI 31.45 kg/m  General:   Alert,  pleasant and cooperative in NAD Head:  Normocephalic and atraumatic. Neck:  Supple; no masses or thyromegaly. Lungs:  Clear throughout to auscultation, normal respiratory effort.    Heart:  +S1, +S2, Regular rate and rhythm, No edema. Abdomen:  Soft, nontender and nondistended. Normal bowel sounds, without guarding, and without rebound.   Neurologic:  Alert and  oriented x4;  grossly normal neurologically.  Impression/Plan: Kelly Schmidt is here for an colonoscopy to be performed for Screening colonoscopy average risk   Risks, benefits, limitations, and alternatives regarding  colonoscopy have been reviewed with the patient.  Questions have been answered.  All parties agreeable.   Alease Medina, MD  03/13/2020, 8:00 AM

## 2020-03-13 NOTE — Anesthesia Preprocedure Evaluation (Signed)
Anesthesia Evaluation  Patient identified by MRN, date of birth, ID band Patient awake    Reviewed: Allergy & Precautions, H&P , NPO status , Patient's Chart, lab work & pertinent test results, reviewed documented beta blocker date and time   History of Anesthesia Complications Negative for: history of anesthetic complications  Airway Mallampati: II  TM Distance: >3 FB Neck ROM: full    Dental  (+) Dental Advidsory Given, Caps, Teeth Intact   Pulmonary neg shortness of breath, asthma (Reactive airway dz) , neg sleep apnea, neg COPD, neg recent URI, Not current smoker,    Pulmonary exam normal breath sounds clear to auscultation       Cardiovascular Exercise Tolerance: Good negative cardio ROS Normal cardiovascular exam Rhythm:regular Rate:Normal     Neuro/Psych negative neurological ROS  negative psych ROS   GI/Hepatic negative GI ROS, Neg liver ROS,   Endo/Other  neg diabetesHypothyroidism   Renal/GU negative Renal ROS  negative genitourinary   Musculoskeletal   Abdominal   Peds  Hematology negative hematology ROS (+)   Anesthesia Other Findings Past Medical History: No date: DVT (deep venous thrombosis) (HCC) No date: Thyroid nodule No date: Urticaria   Reproductive/Obstetrics negative OB ROS                             Anesthesia Physical Anesthesia Plan  ASA: II  Anesthesia Plan: General   Post-op Pain Management:    Induction: Intravenous  PONV Risk Score and Plan: 3 and Propofol infusion and TIVA  Airway Management Planned: Natural Airway and Nasal Cannula  Additional Equipment:   Intra-op Plan:   Post-operative Plan:   Informed Consent: I have reviewed the patients History and Physical, chart, labs and discussed the procedure including the risks, benefits and alternatives for the proposed anesthesia with the patient or authorized representative who has indicated  his/her understanding and acceptance.     Dental Advisory Given  Plan Discussed with: Anesthesiologist, CRNA and Surgeon  Anesthesia Plan Comments:         Anesthesia Quick Evaluation

## 2020-03-13 NOTE — Transfer of Care (Signed)
Immediate Anesthesia Transfer of Care Note  Patient: Kelly Schmidt  Procedure(s) Performed: COLONOSCOPY WITH PROPOFOL (N/A )  Patient Location: Endoscopy Unit  Anesthesia Type:General  Level of Consciousness: drowsy and responds to stimulation  Airway & Oxygen Therapy: Patient Spontanous Breathing and Patient connected to face mask oxygen  Post-op Assessment: Report given to RN and Post -op Vital signs reviewed and stable  Post vital signs: Reviewed and stable  Last Vitals:  Vitals Value Taken Time  BP 103/69 03/13/20 0823  Temp 36 C 03/13/20 0823  Pulse 98 03/13/20 0823  Resp 17 03/13/20 0823  SpO2 99 % 03/13/20 0823    Last Pain:  Vitals:   03/13/20 0744  TempSrc: Temporal  PainSc: 0-No pain         Complications: No complications documented.

## 2020-03-16 ENCOUNTER — Encounter: Payer: Self-pay | Admitting: Gastroenterology

## 2020-04-06 ENCOUNTER — Encounter: Payer: Self-pay | Admitting: *Deleted

## 2020-04-16 ENCOUNTER — Ambulatory Visit (INDEPENDENT_AMBULATORY_CARE_PROVIDER_SITE_OTHER): Payer: BC Managed Care – PPO

## 2020-04-16 ENCOUNTER — Other Ambulatory Visit: Payer: Self-pay

## 2020-04-16 DIAGNOSIS — Z23 Encounter for immunization: Secondary | ICD-10-CM | POA: Diagnosis not present

## 2020-06-05 ENCOUNTER — Encounter: Payer: Self-pay | Admitting: Internal Medicine

## 2020-07-14 ENCOUNTER — Other Ambulatory Visit: Payer: Self-pay | Admitting: "Endocrinology

## 2020-07-14 ENCOUNTER — Other Ambulatory Visit: Payer: Self-pay | Admitting: Obstetrics and Gynecology

## 2020-07-14 DIAGNOSIS — Z1231 Encounter for screening mammogram for malignant neoplasm of breast: Secondary | ICD-10-CM

## 2020-08-19 LAB — RESULTS CONSOLE HPV: CHL HPV: NEGATIVE

## 2020-08-19 LAB — HM PAP SMEAR: HM Pap smear: NEGATIVE

## 2020-09-08 ENCOUNTER — Other Ambulatory Visit: Payer: Self-pay

## 2020-09-08 ENCOUNTER — Ambulatory Visit
Admission: RE | Admit: 2020-09-08 | Discharge: 2020-09-08 | Disposition: A | Discharge status: Home / Self Care | Payer: BC Managed Care – PPO | Source: Ambulatory Visit | Attending: "Endocrinology | Admitting: "Endocrinology

## 2020-09-08 DIAGNOSIS — Z1231 Encounter for screening mammogram for malignant neoplasm of breast: Secondary | ICD-10-CM | POA: Insufficient documentation

## 2020-12-29 ENCOUNTER — Other Ambulatory Visit: Payer: Self-pay

## 2020-12-29 ENCOUNTER — Encounter: Payer: Self-pay | Admitting: Emergency Medicine

## 2020-12-29 ENCOUNTER — Emergency Department
Admission: EM | Admit: 2020-12-29 | Discharge: 2020-12-29 | Disposition: A | Discharge status: Home / Self Care | Payer: BC Managed Care – PPO | Attending: Emergency Medicine | Admitting: Emergency Medicine

## 2020-12-29 DIAGNOSIS — R0602 Shortness of breath: Secondary | ICD-10-CM | POA: Insufficient documentation

## 2020-12-29 DIAGNOSIS — T782XXA Anaphylactic shock, unspecified, initial encounter: Secondary | ICD-10-CM | POA: Diagnosis present

## 2020-12-29 DIAGNOSIS — L500 Allergic urticaria: Secondary | ICD-10-CM | POA: Diagnosis not present

## 2020-12-29 MED ORDER — EPINEPHRINE 0.3 MG/0.3ML IJ SOAJ
0.3000 mg | Freq: Once | INTRAMUSCULAR | Status: AC
  Administered 2020-12-29: 0.3 mg via INTRAMUSCULAR
  Filled 2020-12-29: qty 0.3

## 2020-12-29 MED ORDER — DIPHENHYDRAMINE HCL 50 MG/ML IJ SOLN
50.0000 mg | Freq: Once | INTRAMUSCULAR | Status: AC
  Administered 2020-12-29: 50 mg via INTRAVENOUS
  Filled 2020-12-29: qty 1

## 2020-12-29 MED ORDER — PREDNISONE 20 MG PO TABS: 40.0000 mg | ORAL_TABLET | Freq: Every day | ORAL | 0 refills | Status: AC

## 2020-12-29 MED ORDER — FAMOTIDINE IN NACL 20-0.9 MG/50ML-% IV SOLN
20.0000 mg | Freq: Once | INTRAVENOUS | Status: AC
  Administered 2020-12-29: 20 mg via INTRAVENOUS
  Filled 2020-12-29 (×2): qty 50

## 2020-12-29 MED ORDER — PREDNISONE 20 MG PO TABS: 40.0000 mg | ORAL_TABLET | Freq: Every day | ORAL | 0 refills | Status: DC

## 2020-12-29 MED ORDER — METHYLPREDNISOLONE SODIUM SUCC 125 MG IJ SOLR
125.0000 mg | Freq: Once | INTRAMUSCULAR | Status: AC
  Administered 2020-12-29: 125 mg via INTRAVENOUS
  Filled 2020-12-29: qty 2

## 2020-12-29 MED ORDER — DIPHENHYDRAMINE HCL 25 MG PO TABS: 25.0000 mg | ORAL_TABLET | Freq: Four times a day (QID) | ORAL | 0 refills | Status: DC

## 2020-12-29 MED ORDER — EPINEPHRINE 0.3 MG/0.3ML IJ SOAJ: 0.3000 mg | INTRAMUSCULAR | 3 refills | Status: AC | PRN

## 2020-12-29 NOTE — ED Notes (Signed)
Pt's lips still  noticeably swollen at this time

## 2020-12-29 NOTE — ED Provider Notes (Signed)
[facility] Emergency Department Provider Note  ____________________________________________  Time seen: Approximately 7:20 AM  I have reviewed the triage vital signs and the nursing notes.   HISTORY  Chief Complaint Allergic Reaction    HPI Kelly Schmidt is a 50 y.o. female with past history of idiopathic urticaria who comes ED complaining of swelling of the lips and lower face, hives, and mild shortness of breath.  Started at about 4:00 AM.  Took 2 Benadryl with no relief.  No vomiting or diarrhea.  She is currently on Xolair for the urticaria, last injection was 2 weeks ago.  Denies any new exposures or foods, but does have extensive environmental allergies according to the patient  Symptoms this morning are constant, no aggravating or alleviating factors.  Denies chest pain..      Past Medical History:  Diagnosis Date  . DVT (deep venous thrombosis) (HCC)   . Thyroid nodule   . Urticaria      Patient Active Problem List   Diagnosis Date Noted  . 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 Symptomatic menopausal or female climacteric states 08/07/2019  . Obesity 08/07/2019  . Exposure to combat 08/07/2019  . Cervical radiculopathy 02/11/2019  . Impingement syndrome of shoulder region 02/11/2019  . 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 Low back pain 11/28/2017  . Abnormal uterine bleeding (AUB) 11/25/2017  . 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 Renal insufficiency 12/29/2016  . Lumbar disc disease 03/20/2015  . 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 H/O deep venous thrombosis 12/30/2013  . 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 BUNIONECTOMY    . COLONOSCOPY WITH PROPOFOL N/A 03/13/2020   Procedure: COLONOSCOPY WITH PROPOFOL;  Surgeon: Wyline Mood, MD;  Location: [facility] ENDOSCOPY;  Service: Gastroenterology;  Laterality: N/A;  . CRYOABLATION     heavy periods  .  TONSILLECTOMY       Prior to Admission medications   Medication Sig Start Date End Date Taking? Authorizing Provider  diphenhydrAMINE (BENADRYL) 25 MG tablet Take 1 tablet (25 mg total) by mouth every 6 (six) hours for 3 days. 12/29/20 01/01/21 Yes Sharman Cheek, MD  EPINEPHrine 0.3 mg/0.3 mL IJ SOAJ injection Inject 0.3 mg into the muscle as needed for anaphylaxis. Follow package instructions as needed for severe allergy or anaphylactic reaction. 12/29/20  Yes Sharman Cheek, MD  predniSONE (DELTASONE) 20 MG tablet Take 2 tablets (40 mg total) by mouth daily with breakfast for 4 days. 12/29/20 01/02/21 Yes Sharman Cheek, MD  albuterol (PROVENTIL HFA;VENTOLIN HFA) 108 (90 Base) MCG/ACT inhaler Inhale 1-2 puffs into the lungs every 6 (six) hours as needed for wheezing or shortness of breath. Use with spacer 07/27/17   Lutricia Feil, PA-C  cetirizine (ZYRTEC) 10 MG tablet Take 10 mg by mouth daily.    [provider]  cyclobenzaprine (FLEXERIL) 10 MG tablet Take 1 tablet (10 mg total) by mouth 3 (three) times daily as needed for muscle spasms. 11/28/17   Reubin Milan, MD  diphenhydrAMINE (BENADRYL) 50 MG capsule Take 50 mg by mouth every 6 (six) hours as needed.    [provider]  fluticasone (FLONASE) 50 MCG/ACT nasal spray Place 2 sprays into both nostrils daily. Patient not taking: Reported on 03/13/2020 07/27/17   Lutricia Feil, PA-C  levocetirizine (XYZAL) 5 MG tablet Take 5 mg by mouth every evening.    [provider]  levonorgestrel (MIRENA) 20 MCG/24HR IUD 1 each  by Intrauterine route once.    [provider]  levothyroxine (SYNTHROID) 50 MCG tablet Take by mouth. 11/08/19 11/07/20  [provider]  omalizumab Geoffry Paradise) 150 MG injection Inject into the skin every 28 (twenty-eight) days.    [provider]     Allergies Aspirin and Lexapro [escitalopram]   Family History  Problem Relation Age of Onset  . Hypertension  Mother   . Hypertension Father   . Breast cancer Neg Hx     Social History Social History   Tobacco Use  . Smoking status: Never Smoker  . Smokeless tobacco: Never Used  Vaping Use  . Vaping Use: Never used  Substance Use Topics  . Alcohol use: Yes    Alcohol/week: 1.0 standard drink    Types: 1 Glasses of wine per week  . Drug use: No    Review of Systems  Constitutional:   No fever or chills.  ENT:   No sore throat. No rhinorrhea.  Positive facial swelling Cardiovascular:   No chest pain or syncope. Respiratory: Positive shortness of breath without cough. Gastrointestinal:   Negative for abdominal pain, vomiting and diarrhea.  Musculoskeletal:   Negative for focal pain or swelling All other systems reviewed and are negative except as documented above in ROS and HPI.  ____________________________________________   PHYSICAL EXAM:  VITAL SIGNS: ED Triage Vitals  Enc Vitals Group     BP 12/29/20 0523 124/61     Pulse Rate 12/29/20 0523 78     Resp 12/29/20 0523 18     Temp 12/29/20 0523 98.2 F (36.8 C)     Temp Source 12/29/20 0523 Oral     SpO2 12/29/20 0523 99 %     Weight 12/29/20 0527 198 lb (89.8 kg)     Height 12/29/20 0527 5\' 5"  (1.651 m)     Head Circumference --      Peak Flow --      Pain Score 12/29/20 0527 0     Pain Loc --      Pain Edu? --      Excl. in GC? --     Vital signs reviewed, nursing assessments reviewed.   Constitutional:   Alert and oriented. Non-toxic appearance. Eyes:   Conjunctivae are normal. EOMI. PERRL. ENT      Head:   Normocephalic and atraumatic.  Upper and lower lips are edematous      Nose:   Normal      Mouth/Throat: No tongue swelling or elevation, floor mouth soft, uvula nonedematous      Neck:   No meningismus. Full ROM.  Tissues are soft Hematological/Lymphatic/Immunilogical:   No cervical lymphadenopathy. Cardiovascular:   RRR. Symmetric bilateral radial and DP pulses.  No murmurs. Cap refill less than 2  seconds. Respiratory:   Normal respiratory effort without tachypnea/retractions.  There is mild expiratory wheezing in all lung fields.  No respiratory distress Gastrointestinal:   Soft and nontender. Non distended. There is no CVA tenderness.  No rebound, rigidity, or guarding. Genitourinary:   deferred Musculoskeletal:   Normal range of motion in all extremities. No joint effusions.  No lower extremity tenderness.  No edema. Neurologic:   Normal speech and language.  Motor grossly intact. No acute focal neurologic deficits are appreciated.  Skin:    Skin is warm, dry and intact.  Urticaria on the back and shoulders and upper arms. ____________________________________________    LABS (pertinent positives/negatives) (all labs ordered are listed, but only abnormal results are displayed)  Labs Reviewed - No data to display ____________________________________________   EKG    ____________________________________________    RADIOLOGY  No results found.  ____________________________________________   PROCEDURES Procedures  ____________________________________________    CLINICAL IMPRESSION / ASSESSMENT AND PLAN / ED COURSE  Medications ordered in the ED: Medications  famotidine (PEPCID) IVPB 20 mg premix (has no administration in time range)  methylPREDNISolone sodium succinate (SOLU-MEDROL) 125 mg/2 mL injection 125 mg (125 mg Intravenous Given 12/29/20 0610)  diphenhydrAMINE (BENADRYL) injection 50 mg (50 mg Intravenous Given 12/29/20 0610)  EPINEPHrine (EPI-PEN) injection 0.3 mg (0.3 mg Intramuscular Given 12/29/20 7846)    Pertinent labs & imaging results that were available during my care of the patient were reviewed by me and considered in my medical decision making (see chart for details).  Kelly Schmidt was evaluated in Emergency Department on 12/29/2020 for the symptoms described in the history of present illness. She was evaluated in the context of the global  COVID-19 pandemic, which necessitated consideration that the patient might be at risk for infection with the SARS-CoV-2 virus that causes COVID-19. Institutional protocols and algorithms that pertain to the evaluation of patients at risk for COVID-19 are in a state of rapid change based on information released by regulatory bodies including the CDC and federal and state organizations. These policies and algorithms were followed during the patient's care in the ED.   Patient presents with facial swelling, urticaria, wheezing consistent with anaphylaxis.  Unclear trigger, but vital signs are normal, not in distress.  Will give IV Benadryl Pepcid Solu-Medrol, EpiPen and observe in ED.  With wheezing and lower lip swelling, she will need hospitalization for longer-term observation and there is not significant improvement with initial treatment.      ____________________________________________   FINAL CLINICAL IMPRESSION(S) / ED DIAGNOSES    Final diagnoses:  Anaphylaxis, initial encounter     ED Discharge Orders         Ordered    diphenhydrAMINE (BENADRYL) 25 MG tablet  Every 6 hours        12/29/20 0630    EPINEPHrine 0.3 mg/0.3 mL IJ SOAJ injection  As needed        12/29/20 0630    predniSONE (DELTASONE) 20 MG tablet  Daily with breakfast        12/29/20 0630          Portions of this note were generated with dragon dictation software. Dictation errors may occur despite best attempts at proofreading.   Sharman Cheek, MD 12/29/20 [phone]

## 2020-12-29 NOTE — ED Triage Notes (Signed)
Patient ambulatory to triage with steady gait, without difficulty or distress noted; pt reports awoke with swelling to lips, face and hives with no known cause; 2 benadryl taken at 415am without relief

## 2020-12-29 NOTE — ED Provider Notes (Signed)
7:27 AM Assumed care for off going team.   Blood pressure 124/61, pulse 78, temperature 98.2 F (36.8 C), temperature source Oral, resp. rate 18, height 5\' 5"  (1.651 m), weight 89.8 kg, SpO2 99 %.  See their HPI for full report but in brief Unknown exposure, lower lip swelling, uticaria, wheezing--got pepcid, benadryl, epi pen, steroids at 610AM. Re-eval for possible dc   700AM patient resting comfortably in bed and reports little bit of swelling still in her lips but no other symptoms at this time.  9:54 AM 4 hours post epi injection patient is feeling much better.  She states that she has a little bit of lower lip swelling but otherwise everything else is resolved.  On repeat abdominal exam she has no oropharyngeal swelling, lungs are clear to auscultation.  Patient reports that she already has an EpiPen at home.  We will send her with a course of steroids.  She already has follow-up with [facility].  She denies being on any kind of lisinopril or hypertension medications that could have suggest angioedema  I discussed the provisional nature of ED diagnosis, the treatment so far, the ongoing plan of care, follow up appointments and return precautions with the patient and any family or support people present. They expressed understanding and agreed with the plan, discharged home.            , MD 12/29/20 [phone]

## 2021-01-06 ENCOUNTER — Ambulatory Visit: Payer: Self-pay | Admitting: *Deleted

## 2021-01-06 NOTE — Telephone Encounter (Signed)
Patient scheduled to see Dr Judithann Graves tomorrow.

## 2021-01-06 NOTE — Telephone Encounter (Signed)
Patient is calling to report she is having shoulder pain and tingling that is in her hand and fingers. Patient states her pain is mild/moderate and she does have history of shoulder injury in the past- call to office- they are going to schedule her at the office.

## 2021-01-06 NOTE — Telephone Encounter (Signed)
Reason for Disposition  Numbness (i.e., loss of sensation) in hand or fingers  Answer Assessment - Initial Assessment Questions 1. ONSET: "When did the pain start?"     Last 1 1/2 week 2. LOCATION: "Where is the pain located?"     Shoulder muscle at neck- tightness 3. PAIN: "How bad is the pain?" (Scale 1-10; or mild, moderate, severe)   - MILD (1-3): doesn't interfere with normal activities   - MODERATE (4-7): interferes with normal activities (e.g., work or school) or awakens from sleep   - SEVERE (8-10): excruciating pain, unable to do any normal activities, unable to move arm at all due to pain     Mild/moderate 4. WORK OR EXERCISE: "Has there been any recent work or exercise that involved this part of the body?"     Patient did work out yesterday- previous injury 5. CAUSE: "What do you think is causing the shoulder pain?"     unsure 6. OTHER SYMPTOMS: "Do you have any other symptoms?" (e.g., neck pain, swelling, rash, fever, numbness, weakness)     Tingling in hand and fourth and fifth finger 7. PREGNANCY: "Is there any chance you are pregnant?" "When was your last menstrual period?"     N/a  Protocols used: Shoulder Pain-A-AH

## 2021-01-07 ENCOUNTER — Encounter: Payer: Self-pay | Admitting: Internal Medicine

## 2021-01-07 ENCOUNTER — Other Ambulatory Visit: Payer: Self-pay

## 2021-01-07 ENCOUNTER — Ambulatory Visit (INDEPENDENT_AMBULATORY_CARE_PROVIDER_SITE_OTHER): Payer: BC Managed Care – PPO | Admitting: Internal Medicine

## 2021-01-07 VITALS — BP 104/66 | HR 83 | Temp 98.6°F | Ht 65.0 in | Wt 199.0 lb

## 2021-01-07 DIAGNOSIS — M62838 Other muscle spasm: Secondary | ICD-10-CM | POA: Diagnosis not present

## 2021-01-07 DIAGNOSIS — L509 Urticaria, unspecified: Secondary | ICD-10-CM | POA: Diagnosis not present

## 2021-01-07 MED ORDER — METHOCARBAMOL 500 MG PO TABS: 500.0000 mg | ORAL_TABLET | Freq: Three times a day (TID) | ORAL | 1 refills | Status: DC

## 2021-01-07 NOTE — Patient Instructions (Signed)
Use heat on the neck and shoulder/upper back  Take Robaxin three time a day when not working/driving; other wise take every bedtime  Take Tylenol with each Robaxin dose.

## 2021-01-07 NOTE — Progress Notes (Signed)
Date:  01/07/2021   Name:  Kelly Schmidt   DOB:  [birth year]   MRN:  [id number]   Chief Complaint: Shoulder Pain (X1 week more freq. Has been happening on and off, left shoulder, pain radiates in neck, when turning head it hurts, pain goes down into her fingers, swelling sometimes)  Shoulder Pain  The pain is present in the neck and left shoulder. This is a new problem. The current episode started in the past 7 days. There has been no history of extremity trauma. The problem occurs constantly. The problem has been unchanged. The quality of the pain is described as aching and burning. Associated symptoms include a limited range of motion and tingling (in arm and fingers). Pertinent negatives include no fever. The symptoms are aggravated by activity and lying down. She has tried heat for the symptoms. The treatment provided no relief.   Lab Results  Component Value Date   CREATININE 1.15 (H) 12/29/2016   BUN 16 12/29/2016   NA 138 12/29/2016   K 4.4 12/29/2016   CL 100 12/29/2016   CO2 23 12/29/2016   Lab Results  Component Value Date   CHOL 136 06/02/2013   HDL 59 06/02/2013   LDLCALC 57 06/02/2013   TRIG 101 06/02/2013   Lab Results  Component Value Date   TSH 5.39 (H) 06/02/2013   No results found for: HGBA1C Lab Results  Component Value Date   WBC 7.3 06/02/2013   HGB 12.5 06/02/2013   HCT 37.3 06/02/2013   MCV 87 06/02/2013   PLT 210 06/02/2013   Lab Results  Component Value Date   ALT 15 06/01/2013   AST 10 (L) 06/01/2013   ALKPHOS 70 06/01/2013   BILITOT 0.3 06/01/2013     Review of Systems  Constitutional:  Negative for chills, fatigue and fever.  Respiratory:  Negative for cough, chest tightness and shortness of breath.   Cardiovascular:  Negative for chest pain.  Neurological:  Positive for tingling (in arm and fingers). Negative for dizziness, light-headedness and headaches.   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   Symptomatic menopausal or female climacteric states 08/07/2019   Obesity 08/07/2019   Exposure to combat 08/07/2019   Cervical radiculopathy 02/11/2019   Impingement syndrome of shoulder region 02/11/2019   Foraminal stenosis of cervical region 11/28/2017   Low back pain 11/28/2017   Abnormal uterine bleeding (AUB) 11/25/2017   Chronic idiopathic urticaria 12/29/2016   Renal insufficiency 12/29/2016   Lumbar disc disease 03/20/2015   Airway hyperreactivity 12/30/2013   H/O deep venous thrombosis 12/30/2013   Adaptive colitis 12/30/2013    Allergies  Allergen Reactions   Charentais Melon (French Melon) Hives   Grass Extracts [Gramineae Pollens] Other (See Comments), Hives and Itching   Aspirin Hives   Lidocaine Other (See Comments)   Lexapro [Escitalopram] Itching    Past Surgical History:  Procedure Laterality Date   BUNIONECTOMY     COLONOSCOPY WITH PROPOFOL N/A 03/13/2020   Procedure: COLONOSCOPY WITH PROPOFOL;  Surgeon: Wyline Mood, MD;  Location: [facility] ENDOSCOPY;  Service: Gastroenterology;  Laterality: N/A;   CRYOABLATION     heavy periods   TONSILLECTOMY      Social History   Tobacco Use   Smoking status: Never   Smokeless tobacco: Never  Vaping Use   Vaping Use: Never used  Substance Use Topics   Alcohol use: Yes    Alcohol/week: 1.0 standard drink  Types: 1 Glasses of wine per week   Drug use: No     Medication list has been reviewed and updated.  Current Meds  Medication Sig   albuterol (PROVENTIL HFA;VENTOLIN HFA) 108 (90 Base) MCG/ACT inhaler Inhale 1-2 puffs into the lungs every 6 (six) hours as needed for wheezing or shortness of breath. Use with spacer   baclofen (LIORESAL) 10 MG tablet    budesonide (RHINOCORT AQUA) 32 MCG/ACT nasal spray Place into the nose.   cetirizine (ZYRTEC) 10 MG tablet Take 10 mg by mouth daily.   diclofenac (VOLTAREN) 75 MG EC tablet    diphenhydrAMINE (BENADRYL) 25 MG tablet Take 1  tablet (25 mg total) by mouth every 6 (six) hours for 3 days.   EPINEPHrine 0.3 mg/0.3 mL IJ SOAJ injection Inject 0.3 mg into the muscle as needed for anaphylaxis. Follow package instructions as needed for severe allergy or anaphylactic reaction.   fexofenadine (ALLEGRA) 180 MG tablet Take 1 tablet by mouth daily.   hydrOXYzine (ATARAX/VISTARIL) 25 MG tablet    levocetirizine (XYZAL) 5 MG tablet Take 5 mg by mouth every evening.   levonorgestrel (MIRENA) 20 MCG/24HR IUD 1 each by Intrauterine route once.   levothyroxine (SYNTHROID) 50 MCG tablet Take by mouth.   omalizumab (XOLAIR) 150 MG injection Inject into the skin every 28 (twenty-eight) days.   [DISCONTINUED] diazepam (VALIUM) 2 MG tablet    [DISCONTINUED] EPINEPHrine 0.3 mg/0.3 mL IJ SOAJ injection Inject into the muscle.   [DISCONTINUED] estradiol (VIVELLE-DOT) 0.1 MG/24HR patch    [DISCONTINUED] fluticasone (FLONASE) 50 MCG/ACT nasal spray Place 2 sprays into both nostrils daily.   [DISCONTINUED] levothyroxine (SYNTHROID) 50 MCG tablet    [DISCONTINUED] methylPREDNISolone (MEDROL DOSEPAK) 4 MG TBPK tablet    [DISCONTINUED] nitrofurantoin, macrocrystal-monohydrate, (MACROBID) 100 MG capsule    [DISCONTINUED] phentermine (ADIPEX-P) 37.5 MG tablet    [DISCONTINUED] predniSONE (DELTASONE) 20 MG tablet    [DISCONTINUED] tiZANidine (ZANAFLEX) 4 MG tablet     PHQ 2/9 Scores 01/07/2021 11/13/2019 06/03/2019 11/28/2017  PHQ - 2 Score 0 0 0 0  PHQ- 9 Score 0 0 - -    GAD 7 : Generalized Anxiety Score 01/07/2021 11/13/2019  Nervous, Anxious, on Edge 0 0  Control/stop worrying 0 0  Worry too much - different things 0 0  Trouble relaxing 0 0  Restless 0 0  Easily annoyed or irritable 0 0  Afraid - awful might happen 0 0  Total GAD 7 Score 0 0  Anxiety Difficulty - Not difficult at all    BP Readings from Last 3 Encounters:  01/07/21 104/66  12/29/20 110/70  03/13/20 109/71    Physical Exam Vitals and nursing note reviewed.   Constitutional:      General: She is not in acute distress.    Appearance: Normal appearance. She is well-developed.  HENT:     Head: Normocephalic and atraumatic.  Pulmonary:     Effort: Pulmonary effort is normal. No respiratory distress.  Musculoskeletal:     Cervical back: Muscular tenderness present. Decreased range of motion.       Back:     Comments: Spasm and tenderness over the upper trapezius.  Skin:    General: Skin is warm and dry.     Findings: No rash.  Neurological:     Mental Status: She is alert and oriented to person, place, and time.     Sensory: Sensation is intact.     Motor: No weakness or tremor.  Deep Tendon Reflexes:     Reflex Scores:      Bicep reflexes are 2+ on the right side and 2+ on the left side. Psychiatric:        Mood and Affect: Mood normal.        Behavior: Behavior normal.    Wt Readings from Last 3 Encounters:  01/07/21 199 lb (90.3 kg)  12/29/20 198 lb (89.8 kg)  03/13/20 189 lb (85.7 kg)    BP 104/66   Pulse 83   Temp 98.6 F (37 C) (Oral)   Ht 5\' 5"  (1.651 m)   Wt 199 lb (90.3 kg)   SpO2 96%   BMI 33.12 kg/m   Assessment and Plan: 1. Muscle spasms of neck Affecting the right neck, shoulder and arm Use heat; take Robaxin every 8 hours when not working/driving, otherwise at bedtime Take tylenol with robaxin  - methocarbamol (ROBAXIN) 500 MG tablet; Take 1 tablet (500 mg total) by mouth every 8 (eight) hours.  Dispense: 40 tablet; Refill: 1  2. Urticaria of unknown origin Being followed by Allergist Recent reaction after taking Naproxen so will avoid nsaids   Partially dictated using . Any errors are unintentional.  Animal nutritionist, MD [facility]  01/07/2021

## 2021-01-12 ENCOUNTER — Encounter: Payer: Self-pay | Admitting: Internal Medicine

## 2021-03-07 ENCOUNTER — Encounter: Payer: Self-pay | Admitting: Internal Medicine

## 2021-03-23 ENCOUNTER — Encounter: Payer: Self-pay | Admitting: Internal Medicine

## 2021-03-23 NOTE — Telephone Encounter (Signed)
Please review.  KP

## 2021-03-28 ENCOUNTER — Ambulatory Visit: Admission: EM | Admit: 2021-03-28 | Discharge: 2021-03-28 | Disposition: A | Discharge status: Home / Self Care

## 2021-03-28 ENCOUNTER — Encounter: Payer: Self-pay | Admitting: Emergency Medicine

## 2021-03-28 ENCOUNTER — Other Ambulatory Visit: Payer: Self-pay

## 2021-03-28 DIAGNOSIS — L509 Urticaria, unspecified: Secondary | ICD-10-CM | POA: Diagnosis not present

## 2021-03-28 MED ORDER — PREDNISONE 10 MG PO TABS: ORAL_TABLET | ORAL | 0 refills | Status: DC

## 2021-03-28 NOTE — ED Triage Notes (Signed)
Pt c/o hives. This has been an on going problem for pt. She states she finished prednisone about 3 days ago. She states she even had hives when she was taking the prednisone. She is taking allergy injections. She states this morning when she woke up her throat was feeling "raspy" and it feel ike it is hard to swallow. Hives are over her entire body. She sees an allergist. Pt is tearful during triage, as she is frustrated that she keeps having this problem. She has been taking benadryl, pepcid, and allegra.

## 2021-03-28 NOTE — Discharge Instructions (Addendum)
-  Continue taking your antihistamines as advised by your allergist. -Start prednisone and take as prescribed. -Keep follow-up appointment with your allergist next week for your Xolair injection. -Keep your EpiPen handy and if you experience any increased facial swelling, lip swelling, throat swelling/tightness, increased chest tightness or breathing difficulty use it and call EMS. -You have been going through a lot with this issue over the past 5 years.  I would suggest finding a therapist.  You can go to psychology [email or web address] and find someone who will accept your insurance.  If you have difficulty with this website you can contact your insurance company and the should help you find someone covered by your plan.

## 2021-03-28 NOTE — ED Provider Notes (Signed)
[facility]    CSN: [id number] Arrival date & time: 03/28/21  1027      History   Chief Complaint Chief Complaint  Patient presents with   Urticaria    HPI Kelly Schmidt is a 50 y.o. 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  She says she was previously controlled on multiple antihistamines and Xolair injections.  Patient says since she stopped getting Xolair injections every 4 weeks and moved out to 8 weeks she started to have more flareups of her hives and they became uncontrolled.  She does take Allegra, Pepcid, Zyrtec and is now back to getting Xolair injections every 4 weeks.  Her next injection is in 1 week.  She goes to [facility].  Patient says she has occasionally had to have corticosteroids to help and believes she may be to that point now.  Patient does report that last week she took 40 mg of prednisone x4 days.  This was from an old prescription.  She says when she finished it her hives seem to get worse and have worsened since last night.  Patient admits to numerous allergies.  She denies any new triggers.  She denies any facial swelling, mouth swelling, lip swelling but does admit to some chest tightness.  No breathing difficulty.  She has diffuse hives across the body.  She says that she has been very anxious and generally down since dealing with a flareup of her hives over the past few months.  She says she is also gained weight because she has been on prednisone multiple times.  No other complaints.  HPI  Past Medical History:  Diagnosis Date   DVT (deep venous thrombosis) (HCC)    Thyroid nodule    Urticaria     Patient Active Problem List   Diagnosis Date Noted   Urticaria of unknown origin 01/07/2021   Nontoxic multinodular goiter 08/07/2019   Symptomatic menopausal or female climacteric states 08/07/2019   Obesity  08/07/2019   Exposure to combat 08/07/2019   Cervical radiculopathy 02/11/2019   Impingement syndrome of shoulder region 02/11/2019   Foraminal stenosis of cervical region 11/28/2017   Low back pain 11/28/2017   Abnormal uterine bleeding (AUB) 11/25/2017   Chronic idiopathic urticaria 12/29/2016   Renal insufficiency 12/29/2016   Lumbar disc disease 03/20/2015   Airway hyperreactivity 12/30/2013   H/O deep venous thrombosis 12/30/2013   Adaptive colitis 12/30/2013    Past Surgical History:  Procedure Laterality Date   BUNIONECTOMY     COLONOSCOPY WITH PROPOFOL N/A 03/13/2020   Procedure: COLONOSCOPY WITH PROPOFOL;  Surgeon: Wyline MoodAnna, Kiran, MD;  [facility];  Service: Gastroenterology;  Laterality: N/A;   CRYOABLATION     heavy periods   TONSILLECTOMY      OB History   No obstetric history on file.      Home Medications    Prior to Admission medications   Medication Sig Start Date End Date Taking? Authorizing Provider  albuterol (PROVENTIL HFA;VENTOLIN HFA) 108 (90 Base) MCG/ACT inhaler Inhale 1-2 puffs into the lungs every 6 (six) hours as needed for wheezing or shortness of breath. Use with spacer 07/27/17  Yes Ovid Curdoemer, William P, PA-C  budesonide (RHINOCORT AQUA) 32 MCG/ACT nasal spray Place into the nose. 12/04/20  Yes [provider]  cetirizine (ZYRTEC) 10 MG tablet Take 10 mg by mouth  daily.   Yes [provider]  diphenhydrAMINE (BENADRYL) 25 MG tablet Take 1 tablet (25 mg total) by mouth every 6 (six) hours for 3 days. 12/29/20 03/28/21 Yes Sharman Cheek, MD  EPINEPHrine 0.3 mg/0.3 mL IJ SOAJ injection Inject 0.3 mg into the muscle as needed for anaphylaxis. Follow package instructions as needed for severe allergy or anaphylactic reaction. 12/29/20  Yes Sharman Cheek, MD  fexofenadine (ALLEGRA) 180 MG tablet Take 1 tablet by mouth daily. 11/19/20  Yes [provider]  levonorgestrel (MIRENA) 20 MCG/24HR IUD 1 each by Intrauterine route  once.   Yes [provider]  levothyroxine (SYNTHROID) 50 MCG tablet Take by mouth. 11/08/19 03/28/21 Yes [provider]  omalizumab Geoffry Paradise) 150 MG injection Inject into the skin every 28 (twenty-eight) days.   Yes [provider]  predniSONE (DELTASONE) 10 MG tablet Take 6 tabs PO on day 1 and decrease by 1 tab daily until complete 03/28/21  Yes Shirlee Latch, PA-C  baclofen (LIORESAL) 10 MG tablet  10/23/19   [provider]  diclofenac (VOLTAREN) 75 MG EC tablet  10/23/19   [provider]  famotidine (PEPCID) 20 MG tablet Take 20 mg by mouth 2 (two) times daily. 01/28/21   [provider]  hydrOXYzine (ATARAX/VISTARIL) 25 MG tablet  12/31/20   [provider]  levocetirizine (XYZAL) 5 MG tablet Take 5 mg by mouth every evening.    [provider]  methocarbamol (ROBAXIN) 500 MG tablet Take 1 tablet (500 mg total) by mouth every 8 (eight) hours. 01/07/21   Reubin Milan, MD    Family History Family History  Problem Relation Age of Onset   Hypertension Mother    Hypertension Father    Breast cancer Neg Hx     Social History Social History   Tobacco Use   Smoking status: Never   Smokeless tobacco: Never  Vaping Use   Vaping Use: Never used  Substance Use Topics   Alcohol use: Yes    Alcohol/week: 1.0 standard drink    Types: 1 Glasses of wine per week   Drug use: No     Allergies   Charentais melon (french melon), Grass extracts [gramineae pollens], Aspirin, Lidocaine, and Lexapro [escitalopram]   Review of Systems Review of Systems  Constitutional:  Negative for fatigue.  HENT:  Negative for facial swelling, trouble swallowing and voice change.   Respiratory:  Positive for chest tightness. Negative for shortness of breath.   Gastrointestinal:  Negative for nausea and vomiting.  Skin:  Positive for rash.  Neurological:  Negative for dizziness, weakness and headaches.  Psychiatric/Behavioral:  The  patient is nervous/anxious.     Physical Exam Triage Vital Signs ED Triage Vitals [03/28/21 1046]  Enc Vitals Group     BP 115/77     Pulse Rate (!) 106     Resp 18     Temp 98.1 F (36.7 C)     Temp Source Oral     SpO2 97 %     Weight 199 lb 1.2 oz (90.3 kg)     Height 5\' 5"  (1.651 m)     Head Circumference      Peak Flow      Pain Score 0     Pain Loc      Pain Edu?      Excl. in GC?    No data found.  Updated Vital Signs BP 115/77 (BP Location: Left Arm)   Pulse (!) 106  Temp 98.1 F (36.7 C) (Oral)   Resp 18   Ht 5\' 5"  (1.651 m)   Wt 199 lb 1.2 oz (90.3 kg)   SpO2 97%   BMI 33.13 kg/m      Physical Exam Vitals and nursing note reviewed.  Constitutional:      General: She is not in acute distress.    Appearance: Normal appearance. She is not ill-appearing or toxic-appearing.     Comments: Patient is tearful in exam room when speaking about her chronic hives.   HENT:     Head: Normocephalic and atraumatic.     Nose: Nose normal.     Mouth/Throat:     Mouth: Mucous membranes are moist.     Pharynx: Oropharynx is clear.     Comments: No intraoral swelling. No lip swelling or facial swelling noted Eyes:     General: No scleral icterus.       Right eye: No discharge.        Left eye: No discharge.     Conjunctiva/sclera: Conjunctivae normal.  Cardiovascular:     Rate and Rhythm: Regular rhythm. Tachycardia present.     Heart sounds: Normal heart sounds.  Pulmonary:     Effort: Pulmonary effort is normal. No respiratory distress.     Breath sounds: Normal breath sounds.  Musculoskeletal:     Cervical back: Neck supple.  Skin:    General: Skin is dry.     Findings: Rash (erythematous wheals diffusely on extremities and chest) present.  Neurological:     General: No focal deficit present.     Mental Status: She is alert. Mental status is at baseline.     Motor: No weakness.     Coordination: Coordination normal.     Gait: Gait normal.   Psychiatric:        Mood and Affect: Mood normal.        Behavior: Behavior normal.        Thought Content: Thought content normal.     UC Treatments / Results  Labs (all labs ordered are listed, but only abnormal results are displayed) Labs Reviewed - No data to display  EKG   Radiology No results found.  Procedures Procedures (including critical care time)  Medications Ordered in UC Medications - No data to display  Initial Impression / Assessment and Plan / UC Course  I have reviewed the triage vital signs and the nursing notes.  Pertinent labs & imaging results that were available during my care of the patient were reviewed by me and considered in my medical decision making (see chart for details).  50 year old female presenting for chronic hives.  She already takes Allegra, Zyrtec, Pepcid and hydroxyzine occasionally.  She also gets Xolair injections monthly.  Patient takes prednisone for acute flares every now and then.  She does go to [facility].  Patient presently complaining of diffuse hives and some chest tightness.  No facial or intraoral swelling.  No difficulty breathing.  Patient does have an EpiPen in case she needs to use it.  She says she carries it with her in her purse.  Vitals are all stable.  She is tearful at times when talking about her condition.  She does have diffuse urticaria.  Her chest is clear to auscultation.  Mildly tachycardic but does appear anxious and tearful at times.  I have advised restarting prednisone and continue with antihistamines.  I also suggested her going on to [email or web address] to  try to find a therapist to discuss her anxiety and low mood related to her chronic condition.  Patient states that she will do so.  ED precautions reviewed.   Final Clinical Impressions(s) / UC Diagnoses   Final diagnoses:  Urticaria     Discharge Instructions      -Continue taking your antihistamines as advised by your  allergist. -Start prednisone and take as prescribed. -Keep follow-up appointment with your allergist next week for your Xolair injection. -Keep your EpiPen handy and if you experience any increased facial swelling, lip swelling, throat swelling/tightness, increased chest tightness or breathing difficulty use it and call EMS. -You have been going through a lot with this issue over the past 5 years.  I would suggest finding a therapist.  You can go to [email or web address] and find someone who will accept your insurance.  If you have difficulty with this website you can contact your insurance company and the should help you find someone covered by your plan.     ED Prescriptions     Medication Sig Dispense Auth. Provider   predniSONE (DELTASONE) 10 MG tablet Take 6 tabs PO on day 1 and decrease by 1 tab daily until complete 21 tablet Shirlee Latch, PA-C      PDMP not reviewed this encounter.   Shirlee Latch, PA-C 03/28/21 1152

## 2021-03-30 ENCOUNTER — Encounter: Payer: Self-pay | Admitting: Internal Medicine

## 2021-04-05 ENCOUNTER — Ambulatory Visit: Payer: Self-pay | Admitting: *Deleted

## 2021-04-05 NOTE — Telephone Encounter (Signed)
Pt advised to go to ED. ? ?KP ?

## 2021-04-05 NOTE — Telephone Encounter (Signed)
Reason for Disposition  History of prior "blood clot" in leg or lungs (i.e., deep vein thrombosis, pulmonary embolism)  Answer Assessment - Initial Assessment Questions 1. LOCATION: "Where does it hurt?"       Center of chest  2. RADIATION: "Does the pain go anywhere else?" (e.g., into neck, jaw, arms, back)     No  3. ONSET: "When did the chest pain begin?" (Minutes, hours or days)      3 weeks ago  4. PATTERN "Does the pain come and go, or has it been constant since it started?"  "Does it get worse with exertion?"      Comes and goes and "takes breath away " when dull pressure noted  5. DURATION: "How long does it last" (e.g., seconds, minutes, hours)     5  - 10 minutes  6. SEVERITY: "How bad is the pain?"  (e.g., Scale 1-10; mild, moderate, or severe)    - MILD (1-3): doesn't interfere with normal activities     - MODERATE (4-7): interferes with normal activities or awakens from sleep    - SEVERE (8-10): excruciating pain, unable to do any normal activities       8-9 / 10  7. CARDIAC RISK FACTORS: "Do you have any history of heart problems or risk factors for heart disease?" (e.g., angina, prior heart attack; diabetes, high blood pressure, high cholesterol, smoker, or strong family history of heart disease)     Hx heart history in family  8. PULMONARY RISK FACTORS: "Do you have any history of lung disease?"  (e.g., blood clots in lung, asthma, emphysema, birth control pills)     Hx blood clots in lungs 2018 9. CAUSE: "What do you think is causing the chest pain?"     Pepcid medication  10. OTHER SYMPTOMS: "Do you have any other symptoms?" (e.g., dizziness, nausea, vomiting, sweating, fever, difficulty breathing, cough)       "Takes breath away' when pain starts , pain with swallowing , left inner thigh tender to touch, feels like electric shock 11. PREGNANCY: "Is there any chance you are pregnant?" "When was your last menstrual period?"       na  Protocols used: Chest Pain-A-AH

## 2021-04-05 NOTE — Telephone Encounter (Signed)
C/o chest pain in center of chest dull pressure x 3 weeks. Patient reports no pain at this time. C/o issues with urticaria and taking pepcid to treat and continued to have rash even after starting pepcid. Patient c/o starting to have chest pain and thought it may be the pepcid and stopped but is still experiencing chest pain lasting 5-10 minutes , comes and goes, "takes breath away" when episode noted. C/o inner left thigh tender to touch and feels like "electric shock". Last chest pain episode last night at 9 pm. Hx blood clot in lungs 2018. Denies difficulty breathing, weakness or passing out. Denies pain radiating to other areas. Denies dizziness. Instructed patient to go to ED due to hx blood clots in lungs. Care advise given . Patient verbalized understanding of care advise and to go to ED or  call 911 if symptoms worsen.

## 2021-08-27 ENCOUNTER — Other Ambulatory Visit: Payer: Self-pay | Admitting: Obstetrics and Gynecology

## 2021-08-27 ENCOUNTER — Encounter: Payer: Self-pay | Admitting: Internal Medicine

## 2021-08-27 ENCOUNTER — Ambulatory Visit (INDEPENDENT_AMBULATORY_CARE_PROVIDER_SITE_OTHER): Payer: BC Managed Care – PPO | Admitting: Internal Medicine

## 2021-08-27 ENCOUNTER — Other Ambulatory Visit: Payer: Self-pay

## 2021-08-27 VITALS — BP 110/64 | HR 89 | Ht 65.0 in | Wt 201.0 lb

## 2021-08-27 DIAGNOSIS — Z1231 Encounter for screening mammogram for malignant neoplasm of breast: Secondary | ICD-10-CM

## 2021-08-27 DIAGNOSIS — G5601 Carpal tunnel syndrome, right upper limb: Secondary | ICD-10-CM | POA: Insufficient documentation

## 2021-08-27 DIAGNOSIS — Z23 Encounter for immunization: Secondary | ICD-10-CM

## 2021-08-27 NOTE — Progress Notes (Signed)
Date:  08/27/2021   Name:  Kelly Schmidt   DOB:  01-18-1971   MRN:  [id number]   Chief Complaint: Hand Pain (Numbness in finger tips )  Hand Pain  Incident onset: 2-3 months. The injury mechanism was repetitive motion. The pain is present in the right hand and left hand. The quality of the pain is described as aching and cramping. The pain radiates to the left arm and right arm (shoulders also, right hand worse, pain in neck on let side). The pain is at a severity of 3/10. The pain is mild. The pain has been Worsening since the incident. Associated symptoms include muscle weakness, numbness and tingling. Pertinent negatives include no chest pain. The symptoms are aggravated by movement and lifting. She has tried acetaminophen for the symptoms. The treatment provided mild relief.   Lab Results  Component Value Date   NA 138 12/29/2016   K 4.4 12/29/2016   CO2 23 12/29/2016   GLUCOSE 79 12/29/2016   BUN 16 12/29/2016   CREATININE 1.15 (H) 12/29/2016   CALCIUM 9.2 12/29/2016   GFRNONAA 57 (L) 12/29/2016   Lab Results  Component Value Date   CHOL 136 06/02/2013   HDL 59 06/02/2013   LDLCALC 57 06/02/2013   TRIG 101 06/02/2013   Lab Results  Component Value Date   TSH 5.39 (H) 06/02/2013   No results found for: HGBA1C Lab Results  Component Value Date   WBC 7.3 06/02/2013   HGB 12.5 06/02/2013   HCT 37.3 06/02/2013   MCV 87 06/02/2013   PLT 210 06/02/2013   Lab Results  Component Value Date   ALT 15 06/01/2013   AST 10 (L) 06/01/2013   ALKPHOS 70 06/01/2013   BILITOT 0.3 06/01/2013   No results found for: 25OHVITD2, 25OHVITD3, VD25OH   Review of Systems  Constitutional:  Negative for chills, fatigue and fever.  HENT:  Negative for trouble swallowing.   Respiratory:  Negative for cough, chest tightness and shortness of breath.   Cardiovascular:  Negative for chest pain.  Musculoskeletal:  Positive for arthralgias. Negative for joint swelling and myalgias.   Skin:  Positive for rash.  Neurological:  Positive for tingling, weakness and numbness.  Psychiatric/Behavioral:  Negative for dysphoric mood and sleep disturbance. The patient is nervous/anxious.    Patient Active Problem List   Diagnosis Date Noted   Carpal tunnel syndrome of right wrist 08/27/2021   Urticaria of unknown origin 01/07/2021   Nontoxic multinodular goiter 08/07/2019   Symptomatic menopausal or female climacteric states 08/07/2019   Obesity 08/07/2019   Exposure to combat 08/07/2019   Cervical radiculopathy 02/11/2019   Impingement syndrome of shoulder region 02/11/2019   Foraminal stenosis of cervical region 11/28/2017   Low back pain 11/28/2017   Abnormal uterine bleeding (AUB) 11/25/2017   Chronic idiopathic urticaria 12/29/2016   Renal insufficiency 12/29/2016   Lumbar disc disease 03/20/2015   Airway hyperreactivity 12/30/2013   H/O deep venous thrombosis 12/30/2013   Adaptive colitis 12/30/2013    Allergies  Allergen Reactions   Charentais Melon (French Melon) Hives   Grass Extracts [Gramineae Pollens] Other (See Comments), Hives and Itching   Aspirin Hives   Ibuprofen    Lidocaine Other (See Comments)   Nsaids    Lexapro [Escitalopram] Itching    Past Surgical History:  Procedure Laterality Date   BUNIONECTOMY     COLONOSCOPY WITH PROPOFOL N/A 03/13/2020   Procedure: COLONOSCOPY WITH PROPOFOL;  Surgeon: Jonathon Bellows, MD;  Location: [facility] ENDOSCOPY;  Service: Gastroenterology;  Laterality: N/A;   CRYOABLATION     heavy periods   TONSILLECTOMY      Social History   Tobacco Use   Smoking status: Never   Smokeless tobacco: Never  Vaping Use   Vaping Use: Never used  Substance Use Topics   Alcohol use: Yes    Alcohol/week: 1.0 standard drink    Types: 1 Glasses of wine per week   Drug use: No     Medication list has been reviewed and updated.  Current Meds  Medication Sig   albuterol (PROVENTIL HFA;VENTOLIN HFA) 108 (90 Base) MCG/ACT  inhaler Inhale 1-2 puffs into the lungs every 6 (six) hours as needed for wheezing or shortness of breath. Use with spacer   budesonide (RHINOCORT AQUA) 32 MCG/ACT nasal spray Place into the nose.   dapsone 100 MG tablet Take 150 mg by mouth 2 (two) times daily.   diphenhydrAMINE (BENADRYL) 25 MG tablet Take 1 tablet (25 mg total) by mouth every 6 (six) hours for 3 days. (Patient taking differently: Take 25 mg by mouth as needed.)   EPINEPHrine 0.3 mg/0.3 mL IJ SOAJ injection Inject 0.3 mg into the muscle as needed for anaphylaxis. Follow package instructions as needed for severe allergy or anaphylactic reaction.   fexofenadine (ALLEGRA) 180 MG tablet Take 1 tablet by mouth daily.   hydrOXYzine (ATARAX/VISTARIL) 25 MG tablet    levonorgestrel (MIRENA) 20 MCG/24HR IUD 1 each by Intrauterine route once.   levothyroxine (SYNTHROID) 88 MCG tablet Take 88 mcg by mouth daily.   omalizumab (XOLAIR) 150 MG injection Inject into the skin every 14 (fourteen) days.   [DISCONTINUED] cetirizine (ZYRTEC) 10 MG tablet Take 10 mg by mouth daily.    PHQ 2/9 Scores 08/27/2021 01/07/2021 11/13/2019 06/03/2019  PHQ - 2 Score 1 0 0 0  PHQ- 9 Score 2 0 0 -    GAD 7 : Generalized Anxiety Score 08/27/2021 01/07/2021 11/13/2019  Nervous, Anxious, on Edge 1 0 0  Control/stop worrying 1 0 0  Worry too much - different things 1 0 0  Trouble relaxing 0 0 0  Restless 0 0 0  Easily annoyed or irritable 1 0 0  Afraid - awful might happen 0 0 0  Total GAD 7 Score 4 0 0  Anxiety Difficulty - - Not difficult at all    BP Readings from Last 3 Encounters:  08/27/21 110/64  03/28/21 115/77  01/07/21 104/66    Physical Exam Vitals and nursing note reviewed.  Constitutional:      General: She is not in acute distress.    Appearance: She is well-developed.  HENT:     Head: Normocephalic and atraumatic.  Cardiovascular:     Rate and Rhythm: Normal rate and regular rhythm.  Pulmonary:     Effort: Pulmonary effort is  normal. No respiratory distress.     Breath sounds: No wheezing or rhonchi.  Musculoskeletal:     Right wrist: No swelling.     Comments: + Tinels and Phalens  Skin:    General: Skin is warm and dry.     Findings: No rash.  Neurological:     Mental Status: She is alert and oriented to person, place, and time.  Psychiatric:        Mood and Affect: Mood normal.        Behavior: Behavior normal.    Wt Readings from Last 3 Encounters:  08/27/21 201 lb (91.2 kg)  03/28/21  199 lb 1.2 oz (90.3 kg)  01/07/21 199 lb (90.3 kg)    BP 110/64    Pulse 89    Ht 5\' 5"  (1.651 m)    Wt 201 lb (91.2 kg)    SpO2 96%    BMI 33.45 kg/m   Assessment and Plan: 1. Carpal tunnel syndrome of right wrist Wear wrist splint at night Use heat or ice if painful May need to return to [facility] for injection/surgery if worsening   Partially dictated using Editor, commissioning. Any errors are unintentional.  Halina Maidens, MD [facility]  08/27/2021

## 2021-09-01 ENCOUNTER — Ambulatory Visit: Payer: BC Managed Care – PPO | Admitting: Internal Medicine

## 2021-10-04 ENCOUNTER — Other Ambulatory Visit: Payer: Self-pay

## 2021-10-04 ENCOUNTER — Ambulatory Visit
Admission: RE | Admit: 2021-10-04 | Discharge: 2021-10-04 | Disposition: A | Discharge status: Home / Self Care | Payer: BC Managed Care – PPO | Source: Ambulatory Visit | Attending: Obstetrics and Gynecology | Admitting: Obstetrics and Gynecology

## 2021-10-04 DIAGNOSIS — Z1231 Encounter for screening mammogram for malignant neoplasm of breast: Secondary | ICD-10-CM | POA: Diagnosis present

## 2022-01-20 ENCOUNTER — Telehealth: Payer: BC Managed Care – PPO | Admitting: Internal Medicine

## 2022-01-21 ENCOUNTER — Encounter: Payer: Self-pay | Admitting: Internal Medicine

## 2022-01-21 ENCOUNTER — Ambulatory Visit (INDEPENDENT_AMBULATORY_CARE_PROVIDER_SITE_OTHER): Payer: BC Managed Care – PPO | Admitting: Internal Medicine

## 2022-01-21 VITALS — BP 124/82 | HR 93 | Ht 65.0 in | Wt 201.0 lb

## 2022-01-21 DIAGNOSIS — M25473 Effusion, unspecified ankle: Secondary | ICD-10-CM

## 2022-01-21 DIAGNOSIS — M722 Plantar fascial fibromatosis: Secondary | ICD-10-CM | POA: Diagnosis not present

## 2022-01-21 MED ORDER — PREDNISONE 10 MG PO TABS: 10.0000 mg | ORAL_TABLET | ORAL | 0 refills | Status: AC

## 2022-01-21 NOTE — Progress Notes (Signed)
Date:  01/21/2022   Name:  Kelly Schmidt   DOB:  September 10, 1970   MRN:  [id number]   Chief Complaint: Edema (Bilateral ankles. X 2 weeks ago. Painful. When sitting for long periods of times the swelling builds up./)  Foot Injury  There was no injury mechanism. The pain is present in the left foot and right foot. The quality of the pain is described as aching and burning. The pain is moderate. The pain has been Fluctuating since onset. She has tried nothing (unable to take NSAIDS) for the symptoms.   Edema - some ankle swelling recently, esp after being on her feet all [place].  She also went to [place] a few weeks ago and consumed more salt than usual.  It goes down over night.  Is not painful.  Equal in both ankles.    Lab Results  Component Value Date   NA 138 12/29/2016   K 4.4 12/29/2016   CO2 23 12/29/2016   GLUCOSE 79 12/29/2016   BUN 16 12/29/2016   CREATININE 1.15 (H) 12/29/2016   CALCIUM 9.2 12/29/2016   GFRNONAA 57 (L) 12/29/2016   Lab Results  Component Value Date   CHOL 136 06/02/2013   HDL 59 06/02/2013   LDLCALC 57 06/02/2013   TRIG 101 06/02/2013   Lab Results  Component Value Date   TSH 5.39 (H) 06/02/2013   No results found for: "HGBA1C" Lab Results  Component Value Date   WBC 7.3 06/02/2013   HGB 12.5 06/02/2013   HCT 37.3 06/02/2013   MCV 87 06/02/2013   PLT 210 06/02/2013   Lab Results  Component Value Date   ALT 15 06/01/2013   AST 10 (L) 06/01/2013   ALKPHOS 70 06/01/2013   BILITOT 0.3 06/01/2013   No results found for: "25OHVITD2", "25OHVITD3", "VD25OH"   Review of Systems  Constitutional:  Negative for fatigue and unexpected weight change.  HENT:  Negative for nosebleeds.   Eyes:  Negative for visual disturbance.  Respiratory:  Negative for cough, chest tightness, shortness of breath and wheezing.   Cardiovascular:  Positive for leg swelling. Negative for chest pain and palpitations.   Gastrointestinal:  Negative for abdominal pain, constipation and diarrhea.  Musculoskeletal:  Positive for arthralgias and gait problem.  Neurological:  Negative for dizziness, weakness, light-headedness and headaches.    Patient Active Problem List   Diagnosis Date Noted   Carpal tunnel syndrome of right wrist 08/27/2021   Urticaria of unknown origin 01/07/2021   Nontoxic multinodular goiter 08/07/2019   Symptomatic menopausal or female climacteric states 08/07/2019   Obesity 08/07/2019   Exposure to combat 08/07/2019   Cervical radiculopathy 02/11/2019   Impingement syndrome of shoulder region 02/11/2019   Foraminal stenosis of cervical region 11/28/2017   Low back pain 11/28/2017   Abnormal uterine bleeding (AUB) 11/25/2017   Chronic idiopathic urticaria 12/29/2016   Renal insufficiency 12/29/2016   Lumbar disc disease 03/20/2015   Airway hyperreactivity 12/30/2013   H/O deep venous thrombosis 12/30/2013   Adaptive colitis 12/30/2013    Allergies  Allergen Reactions   Charentais Melon (French Melon) Hives   Grass Extracts [Gramineae Pollens] Other (See Comments), Hives and Itching   Aspirin Hives   Ibuprofen    Lidocaine Other (See Comments)   Nsaids    Lexapro [Escitalopram] Itching    Past Surgical History:  Procedure Laterality Date   BUNIONECTOMY     COLONOSCOPY WITH PROPOFOL N/A 03/13/2020   Procedure: COLONOSCOPY  WITH PROPOFOL;  Surgeon: Wyline Mood, MD;  Location: [facility] ENDOSCOPY;  Service: Gastroenterology;  Laterality: N/A;   CRYOABLATION     heavy periods   TONSILLECTOMY      Social History   Tobacco Use   Smoking status: Never   Smokeless tobacco: Never  Vaping Use   Vaping Use: Never used  Substance Use Topics   Alcohol use: Yes    Alcohol/week: 1.0 standard drink of alcohol    Types: 1 Glasses of wine per week   Drug use: No     Medication list has been reviewed and updated.  Current Meds  Medication Sig   albuterol (PROVENTIL  HFA;VENTOLIN HFA) 108 (90 Base) MCG/ACT inhaler Inhale 1-2 puffs into the lungs every 6 (six) hours as needed for wheezing or shortness of breath. Use with spacer   budesonide (RHINOCORT AQUA) 32 MCG/ACT nasal spray Place into the nose.   dapsone 100 MG tablet Take 150 mg by mouth 2 (two) times daily.   EPINEPHrine 0.3 mg/0.3 mL IJ SOAJ injection Inject 0.3 mg into the muscle as needed for anaphylaxis. Follow package instructions as needed for severe allergy or anaphylactic reaction.   fexofenadine (ALLEGRA) 180 MG tablet Take 1 tablet by mouth daily.   hydrOXYzine (ATARAX/VISTARIL) 25 MG tablet    levonorgestrel (MIRENA) 20 MCG/24HR IUD 1 each by Intrauterine route once.   levothyroxine (SYNTHROID) 88 MCG tablet Take 88 mcg by mouth daily.   omalizumab (XOLAIR) 150 MG injection Inject into the skin every 14 (fourteen) days.       08/27/2021    8:21 AM 01/07/2021   11:16 AM 11/13/2019    9:53 AM  GAD 7 : Generalized Anxiety Score  Nervous, Anxious, on Edge 1 0 0  Control/stop worrying 1 0 0  Worry too much - different things 1 0 0  Trouble relaxing 0 0 0  Restless 0 0 0  Easily annoyed or irritable 1 0 0  Afraid - awful might happen 0 0 0  Total GAD 7 Score 4 0 0  Anxiety Difficulty   Not difficult at all       08/27/2021    8:21 AM 01/07/2021   11:16 AM 11/13/2019    9:53 AM  Depression screen PHQ 2/9  Decreased Interest 0 0 0  Down, Depressed, Hopeless 1 0 0  PHQ - 2 Score 1 0 0  Altered sleeping 1 0 0  Tired, decreased energy 0 0 0  Change in appetite 0 0 0  Feeling bad or failure about yourself  0 0 0  Trouble concentrating 0 0 0  Moving slowly or fidgety/restless 0 0 0  Suicidal thoughts 0 0 0  PHQ-9 Score 2 0 0  Difficult doing work/chores Not difficult at all Not difficult at all Not difficult at all    BP Readings from Last 3 Encounters:  01/21/22 124/82  08/27/21 110/64  03/28/21 115/77    Physical Exam Vitals and nursing note reviewed.  Constitutional:       General: She is not in acute distress.    Appearance: She is well-developed.  HENT:     Head: Normocephalic and atraumatic.  Cardiovascular:     Rate and Rhythm: Normal rate and regular rhythm.     Pulses: Normal pulses.          Dorsalis pedis pulses are 2+ on the right side and 2+ on the left side.       Posterior tibial pulses are 2+ on  the right side and 2+ on the left side.  Pulmonary:     Effort: Pulmonary effort is normal. No respiratory distress.     Breath sounds: Normal breath sounds. No decreased breath sounds or wheezing.  Musculoskeletal:        General: Tenderness (soles of both feet; no achilles pain) present.     Cervical back: Normal range of motion.     Right lower leg: No edema.     Left lower leg: No edema.  Lymphadenopathy:     Cervical: No cervical adenopathy.  Skin:    General: Skin is warm and dry.     Findings: No rash.  Neurological:     Mental Status: She is alert and oriented to person, place, and time.     Sensory: Sensation is intact.     Motor: Motor function is intact.  Psychiatric:        Mood and Affect: Mood normal.        Behavior: Behavior normal.     Wt Readings from Last 3 Encounters:  01/21/22 201 lb (91.2 kg)  08/27/21 201 lb (91.2 kg)  03/28/21 199 lb 1.2 oz (90.3 kg)    BP 124/82   Pulse 93   Ht 5\' 5"  (1.651 m)   Wt 201 lb (91.2 kg)   SpO2 96%   BMI 33.45 kg/m   Assessment and Plan: 1. Plantar fasciitis Will treat with home stretches and Ice Steroid taper If no improvement, refer to PTx - predniSONE (DELTASONE) 10 MG tablet; Take 1 tablet (10 mg total) by mouth as directed for 6 days. Take 6,5,4,3,2,1 then stop  Dispense: 21 tablet; Refill: 0  2. Mild ankle edema Due to increase in heat, dependent positioning and salt intake Increase water, decrease sodium and elevate if needed   Partially dictated using . Any errors are unintentional.  Animal nutritionist, MD [facility]  01/21/2022

## 2022-01-27 ENCOUNTER — Other Ambulatory Visit: Payer: Self-pay

## 2022-01-27 ENCOUNTER — Encounter: Payer: Self-pay | Admitting: Internal Medicine

## 2022-01-27 DIAGNOSIS — L600 Ingrowing nail: Secondary | ICD-10-CM

## 2022-07-25 IMAGING — MG MM DIGITAL SCREENING BILAT W/ TOMO AND CAD
6 of 10 series · 6 of 30 positions shown · non-contrast
Comparison: Previous exam(s).

CLINICAL DATA: Screening.

EXAM:
DIGITAL SCREENING BILATERAL MAMMOGRAM WITH TOMOSYNTHESIS AND CAD
TECHNIQUE: Bilateral screening digital craniocaudal and mediolateral oblique
mammograms were obtained. Bilateral screening digital breast
tomosynthesis was performed. The images were evaluated with
computer-aided detection.

[L CC synth-2D]
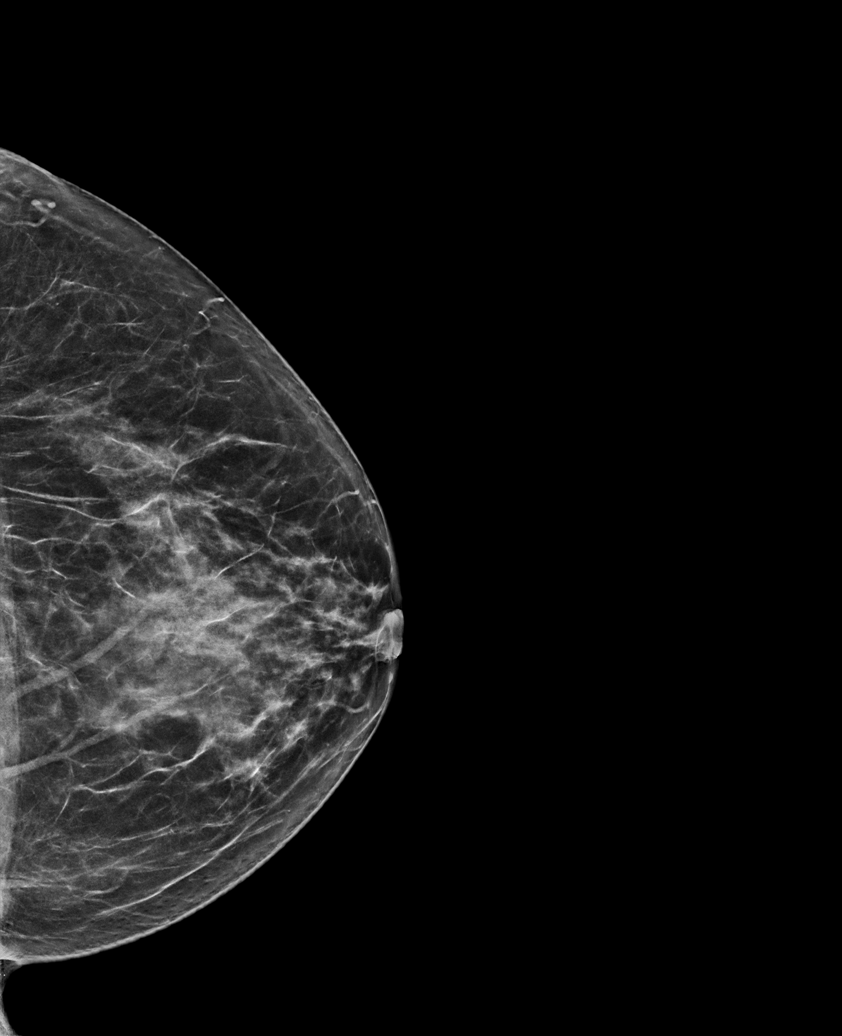

[L MLO synth-2D]
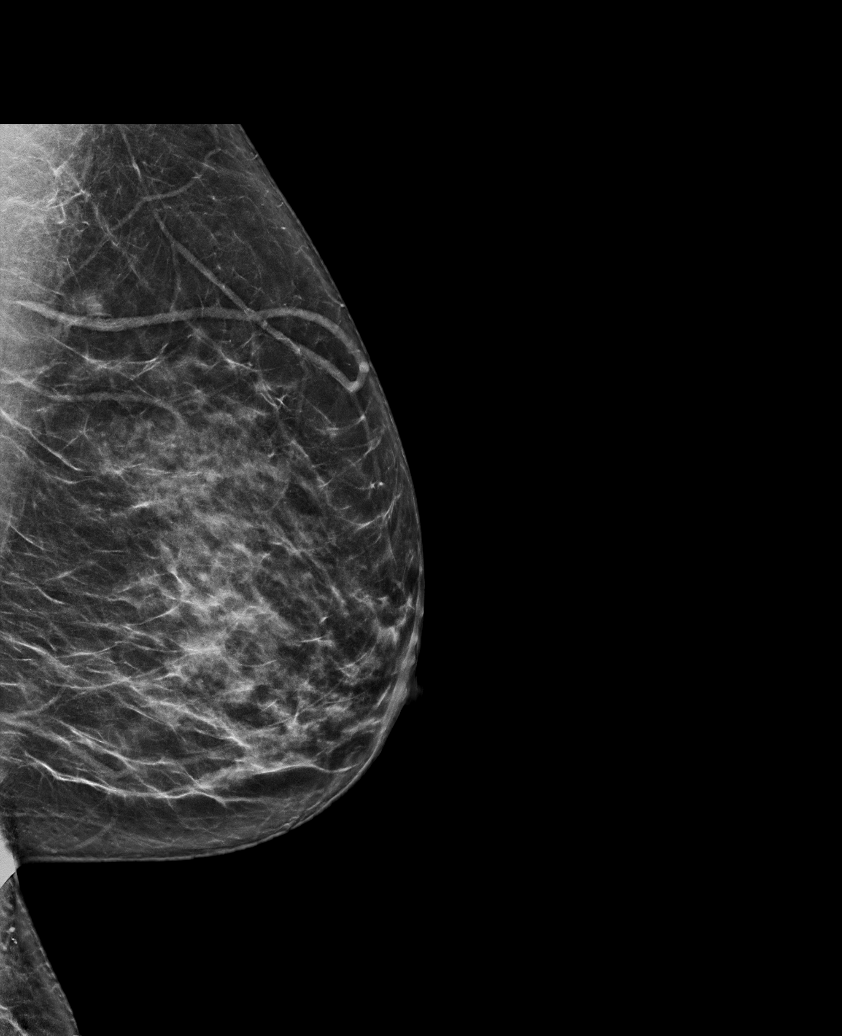

[R MLO synth-2D]
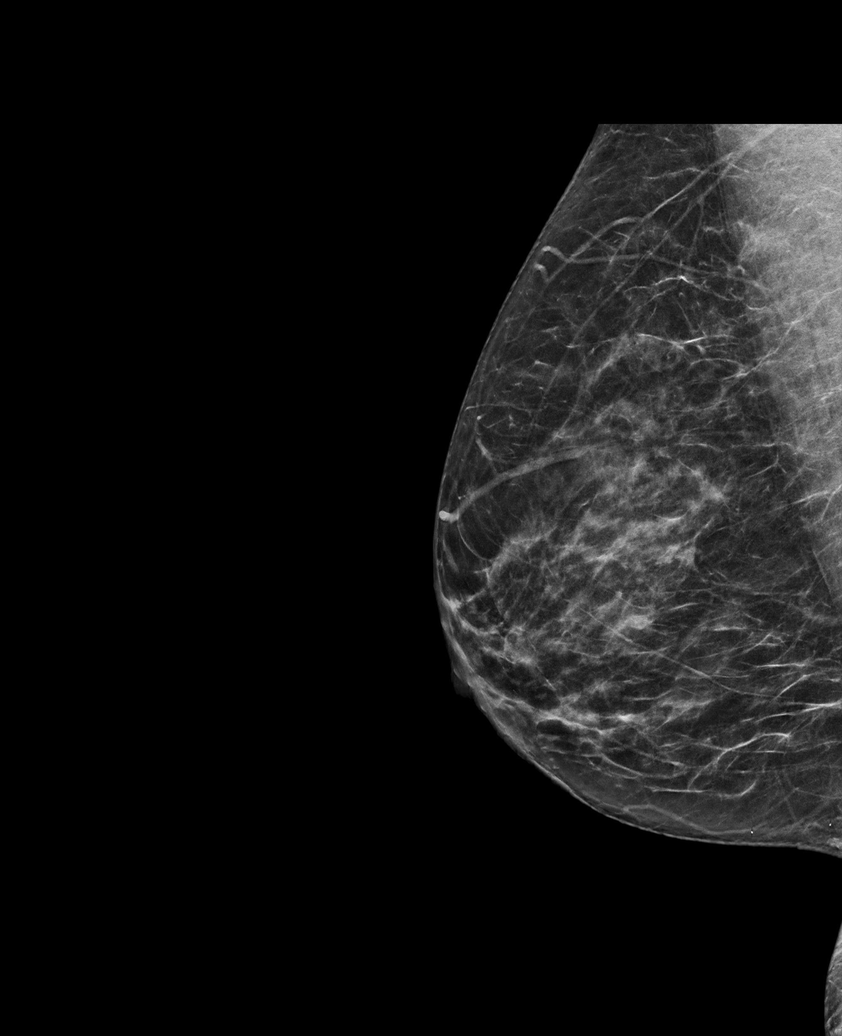

[R CC synth-2D (1 of 2)]
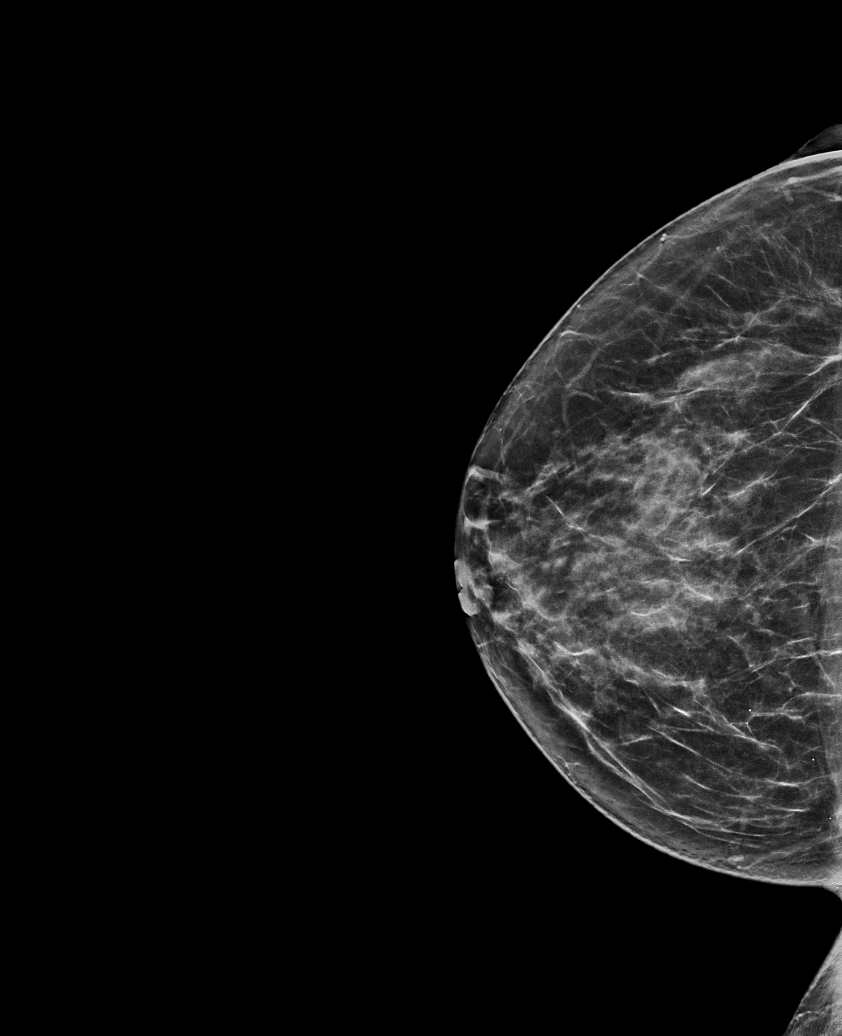

[R CC synth-2D (2 of 2)]
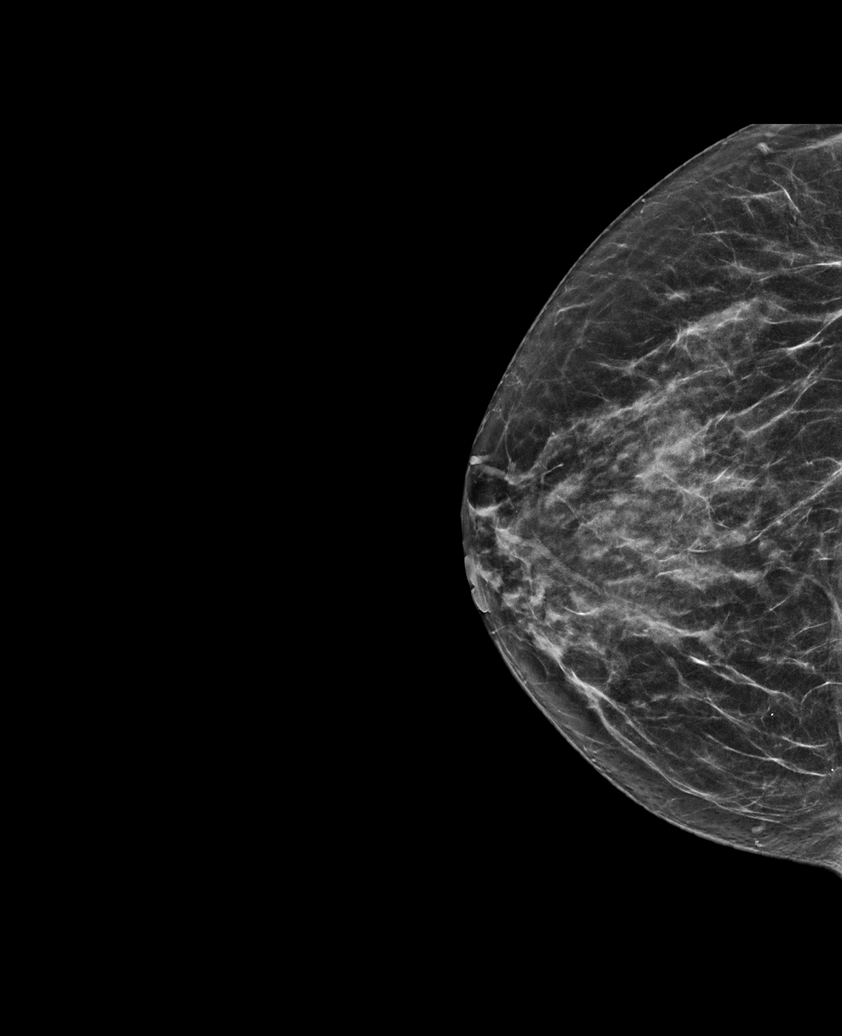

[R CC tomo · tomo slice 33/64.0]
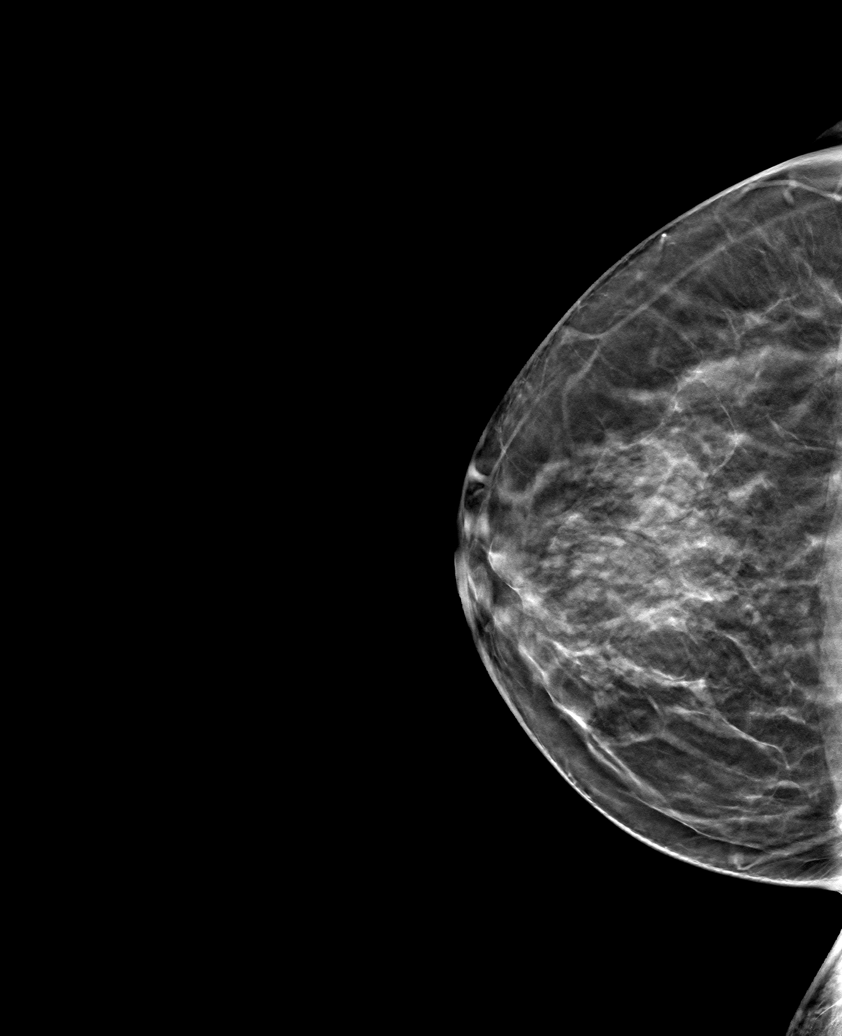

[6 of 30 positions shown; findings below may reference images not displayed]

ACR Breast Density Category c: The breast tissue is heterogeneously
dense, which may obscure small masses.
FINDINGS: There are no findings suspicious for malignancy.
IMPRESSION: No mammographic evidence of malignancy. A result letter of this
screening mammogram will be mailed directly to the patient.

RECOMMENDATION:
Screening mammogram in one year. (Code:Q3-W-BC3)

BI-RADS CATEGORY  1: Negative.

## 2022-10-31 ENCOUNTER — Encounter: Payer: Self-pay | Admitting: Internal Medicine
